# Patient Record
Sex: Male | Born: 1955 | Race: White | Hispanic: No | Marital: Married | Smoking: Never smoker
Health system: Southern US, Community
[De-identification: ages and names within clinical notes are randomized; demographics above are authoritative.]

## PROBLEM LIST (undated history)

## (undated) DIAGNOSIS — N189 Chronic kidney disease, unspecified: Secondary | ICD-10-CM

## (undated) DIAGNOSIS — J189 Pneumonia, unspecified organism: Secondary | ICD-10-CM

## (undated) DIAGNOSIS — N200 Calculus of kidney: Secondary | ICD-10-CM

## (undated) DIAGNOSIS — G4730 Sleep apnea, unspecified: Secondary | ICD-10-CM

## (undated) DIAGNOSIS — D126 Benign neoplasm of colon, unspecified: Secondary | ICD-10-CM

## (undated) HISTORY — DX: Calculus of kidney: N20.0

## (undated) HISTORY — DX: Chronic kidney disease, unspecified: N18.9

## (undated) HISTORY — DX: Sleep apnea, unspecified: G47.30

## (undated) HISTORY — DX: Pneumonia, unspecified organism: J18.9

## (undated) HISTORY — PX: POLYPECTOMY: SHX149

## (undated) HISTORY — PX: HAND SURGERY: SHX662

## (undated) HISTORY — DX: Benign neoplasm of colon, unspecified: D12.6

## (undated) HISTORY — PX: HERNIA REPAIR: SHX51

## (undated) HISTORY — PX: COLONOSCOPY: SHX174

---

## 2000-10-11 ENCOUNTER — Encounter: Payer: Self-pay | Admitting: Emergency Medicine

## 2000-10-11 ENCOUNTER — Emergency Department (HOSPITAL_COMMUNITY): Admission: EM | Admit: 2000-10-11 | Discharge: 2000-10-11 | Payer: Self-pay | Admitting: Emergency Medicine

## 2009-08-31 ENCOUNTER — Emergency Department (HOSPITAL_COMMUNITY): Admission: EM | Admit: 2009-08-31 | Discharge: 2009-08-31 | Payer: Self-pay | Admitting: Emergency Medicine

## 2010-06-07 ENCOUNTER — Encounter (INDEPENDENT_AMBULATORY_CARE_PROVIDER_SITE_OTHER): Payer: Self-pay | Admitting: *Deleted

## 2010-06-09 ENCOUNTER — Encounter (INDEPENDENT_AMBULATORY_CARE_PROVIDER_SITE_OTHER): Payer: Self-pay | Admitting: *Deleted

## 2010-06-13 ENCOUNTER — Encounter: Payer: Self-pay | Admitting: Gastroenterology

## 2010-06-15 NOTE — Letter (Signed)
Summary: Pre Visit Letter Revised  [facility]  [address] [place], [place] [address]   Phone: [phone]  Fax: [phone]        06/07/2010 MRN: [id number] Tom Richardson [address] [place], [place]  [address]             Procedure Date: 06/27/2010 @ 4:00   Direct colon-Dr. Russella Dar   Welcome to the Gastroenterology Division at [facility].    You are scheduled to see a nurse for your pre-procedure visit on 06/13/2010 at 4:30 on the 3rd floor at [facility], [address]  We ask that you try to arrive at our office 15 minutes prior to your appointment time to allow for check-in.  Please take a minute to review the attached form.  If you answer "Yes" to one or more of the questions on the first page, we ask that you call the person listed at your earliest opportunity.  If you answer "No" to all of the questions, please complete the rest of the form and bring it to your appointment.    Your nurse visit will consist of discussing your medical and surgical history, your immediate family medical history, and your medications.   If you are unable to list all of your medications on the form, please bring the medication bottles to your appointment and we will list them.  We will need to be aware of both prescribed and over the counter drugs.  We will need to know exact dosage information as well.    Please be prepared to read and sign documents such as consent forms, a financial agreement, and acknowledgement forms.  If necessary, and with your consent, a friend or relative is welcome to sit-in on the nurse visit with you.  Please bring your insurance card so that we may make a copy of it.  If your insurance requires a referral to see a specialist, please bring your referral form from your primary care physician.  No co-pay is required for this nurse visit.     If you cannot keep your appointment, please call [phone] to cancel or reschedule prior to your  appointment date.  This allows [place] the opportunity to schedule an appointment for another patient in need of care.    Thank you for choosing [facility] for your medical needs.  We appreciate the opportunity to care for you.  Please visit [place] at our website  to learn more about our practice.  Sincerely, The Gastroenterology Division

## 2010-06-23 NOTE — Letter (Signed)
Summary: Moviprep Instructions  [facility]  [address]   [place], [place] [address]   Phone: [phone]  Fax: [phone]       JGUADALUPE OPIELA    11/16/1955    MRN: [id number]        Procedure Day Dorna Bloom: Monday, 06-27-10     Arrival Time: 3:00 p.m.      Procedure Time: 4:00 p.m.     Location of Procedure:                    x  [facility] (4th Floor)                        PREPARATION FOR COLONOSCOPY WITH MOVIPREP   Starting 5 days prior to your procedure 06-22-10 do not eat nuts, seeds, popcorn, corn, beans, peas,  salads, or any raw vegetables.  Do not take any fiber supplements (e.g. Metamucil, Citrucel, and Benefiber).  THE DAY BEFORE YOUR PROCEDURE         DATE: 06-26-10  DAY: Sunday  1.  Drink clear liquids the entire day-NO SOLID FOOD  2.  Do not drink anything colored red or purple.  Avoid juices with pulp.  No orange juice.  3.  Drink at least 64 oz. (8 glasses) of fluid/clear liquids during the day to prevent dehydration and help the prep work efficiently.  CLEAR LIQUIDS INCLUDE: Water Jello Ice Popsicles Tea (sugar ok, no milk/cream) Powdered fruit flavored drinks Coffee (sugar ok, no milk/cream) Gatorade Juice: apple, white grape, white cranberry  Lemonade Clear bullion, consomm, broth Carbonated beverages (any kind) Strained chicken noodle soup Hard Candy                             4.  In the morning, mix first dose of MoviPrep solution:    Empty 1 Pouch A and 1 Pouch B into the disposable container    Add lukewarm drinking water to the top line of the container. Mix to dissolve    Refrigerate (mixed solution should be used within 24 hrs)  5.  Begin drinking the prep at 5:00 p.m. The MoviPrep container is divided by 4 marks.   Every 15 minutes drink the solution down to the next mark (approximately 8 oz) until the full liter is complete.   6.  Follow completed prep with 16 oz of clear liquid of your choice  (Nothing red or purple).  Continue to drink clear liquids until bedtime.  7.  Before going to bed, mix second dose of MoviPrep solution:    Empty 1 Pouch A and 1 Pouch B into the disposable container    Add lukewarm drinking water to the top line of the container. Mix to dissolve    Refrigerate  THE DAY OF YOUR PROCEDURE      DATE:  06-27-10 DAY: Monday  Beginning at 11:00  a.m. (5 hours before procedure):         1. Every 15 minutes, drink the solution down to the next mark (approx 8 oz) until the full liter is complete.  2. Follow completed prep with 16 oz. of clear liquid of your choice.    3. You may drink clear liquids until  2:00 p.m. (2 hours before procedure)   MEDICATION INSTRUCTIONS  Unless otherwise instructed, you should take regular prescription medications with a small sip of water   as early as possible  the morning of your procedure.           OTHER INSTRUCTIONS  You will need a responsible adult at least 53 years of age to accompany you and drive you home.   This person must remain in the waiting room during your procedure.  Wear loose fitting clothing that is easily removed.  Leave jewelry and other valuables at home.  However, you may wish to bring a book to read or  an iPod/MP3 player to listen to music as you wait for your procedure to start.  Remove all body piercing jewelry and leave at home.  Total time from sign-in until discharge is approximately 2-3 hours.  You should go home directly after your procedure and rest.  You can resume normal activities the  day after your procedure.  The day of your procedure you should not:   Drive   Make legal decisions   Operate machinery   Drink alcohol   Return to work  You will receive specific instructions about eating, activities and medications before you leave.    The above instructions have been reviewed and explained to me by   Ezra Sites RN  June 13, 2010 4:42 PM     I fully  understand and can verbalize these instructions _____________________________ Date _________

## 2010-06-23 NOTE — Miscellaneous (Signed)
Summary: [facility]  Clinical Lists Changes  Medications: Added new medication of MOVIPREP 100 GM  SOLR (PEG-KCL-NACL-NASULF-NA ASC-C) As per prep instructions. - Signed Rx of MOVIPREP 100 GM  SOLR (PEG-KCL-NACL-NASULF-NA ASC-C) As per prep instructions.;  #1 x 0;  Signed;  Entered by: Ezra Sites RN;  Authorized by: Meryl Dare MD [facility];  Method used: [facility]  [place] [address], [place], [place]  [address], Ph: [phone] or [phone], Fax: [phone] Allergies: Added new allergy or adverse reaction of TETRACYCLINE Observations: Added new observation of NKA: F (06/13/2010 16:19)    Prescriptions: MOVIPREP 100 GM  SOLR (PEG-KCL-NACL-NASULF-NA ASC-C) As per prep instructions.  #1 x 0   Entered by:   Ezra Sites RN   Authorized by:   Meryl Dare MD [facility]   Signed by:   Ezra Sites RN on 06/13/2010   Method used:   [facility]        [facility]  [place] [address] (retail)       [address]       [place], [place]  [address]       Ph: [phone] or [phone]       Fax: [phone]   RxID:   [id number]

## 2010-06-24 ENCOUNTER — Telehealth: Payer: Self-pay | Admitting: Gastroenterology

## 2010-06-27 ENCOUNTER — Other Ambulatory Visit: Payer: Self-pay | Admitting: Gastroenterology

## 2010-06-27 ENCOUNTER — Other Ambulatory Visit (AMBULATORY_SURGERY_CENTER): Payer: BC Managed Care – PPO | Admitting: Gastroenterology

## 2010-06-27 DIAGNOSIS — K5730 Diverticulosis of large intestine without perforation or abscess without bleeding: Secondary | ICD-10-CM

## 2010-06-27 DIAGNOSIS — K620 Anal polyp: Secondary | ICD-10-CM

## 2010-06-27 DIAGNOSIS — Z1211 Encounter for screening for malignant neoplasm of colon: Secondary | ICD-10-CM

## 2010-06-27 DIAGNOSIS — D126 Benign neoplasm of colon, unspecified: Secondary | ICD-10-CM

## 2010-06-27 DIAGNOSIS — K621 Rectal polyp: Secondary | ICD-10-CM

## 2010-06-27 HISTORY — DX: Benign neoplasm of colon, unspecified: D12.6

## 2010-06-29 NOTE — Progress Notes (Signed)
Summary: Triage  Phone Note Call from Patient Call back at Work Phone [phone]   Caller: Patient Call For: Dr. Russella Dar Reason for Call: Talk to Nurse Summary of Call: Has a upper respiratory injection and taking a Z-pac. Wants to know if it will affect his COL on Monday Initial call taken by: Karna Christmas,  June 24, 2010 9:34 AM  Follow-up for Phone Call        Spoke with pt.  He had a fever two days ago, but has not had one since.  He will finish his zpack on Saturday night.  Ok given to come in for procedure, but told to call if symptoms worsen or he develops a fever. Follow-up by: Karl Bales RN,  June 24, 2010 9:57 AM

## 2010-06-30 ENCOUNTER — Encounter: Payer: Self-pay | Admitting: Gastroenterology

## 2010-07-05 NOTE — Letter (Signed)
Summary: Patient Notice- Polyp Results  [facility]  [address] [place], [place] [address]   Phone: [phone]  Fax: [phone]        June 30, 2010 MRN: [id number]    Tom Richardson [address] [place], [place]  [address]    Dear [name],  I am pleased to inform you that the colon polyp(s) removed during your recent colonoscopy was (were) found to be benign (no cancer detected) upon pathologic examination.  I recommend you have a repeat colonoscopy examination in 5 years to look for recurrent polyps, as having colon polyps increases your risk for having recurrent polyps or even colon cancer in the future.  Should you develop new or worsening symptoms of abdominal pain, bowel habit changes or bleeding from the rectum or bowels, please schedule an evaluation with either your primary care physician or with me.  Continue treatment plan as outlined the day of your exam.  Please call us if you are having persistent problems or have questions about your condition that have not been fully answered at this time.  Sincerely,  Meryl Dare MD [facility]  This letter has been electronically signed by your physician.  Appended Document: Patient Notice- Polyp Results letter mailed

## 2010-07-05 NOTE — Procedures (Signed)
Summary: Colonoscopy  Patient: Tom Richardson Note: All result statuses are Final unless otherwise noted.  Tests: (1) Colonoscopy (COL)   COL Colonoscopy           DONE     [facility]     [address]     [place], [place]  [address]           COLONOSCOPY PROCEDURE REPORT           PATIENT:  Tom Richardson, Tom Richardson  MR#:  [id number]     BIRTHDATE:  05/12/1955, 54 yrs. old  GENDER:  male     ENDOSCOPIST:  Judie Petit T. Russella Dar, MD, [facility]     Referred by:  Prudy Feeler, PA     PROCEDURE DATE:  06/27/2010     PROCEDURE:  Colonoscopy with snare polypectomy     ASA CLASS:  Class I     INDICATIONS:  1) Routine Risk Screening     MEDICATIONS:   Fentanyl 100 mcg IV, Versed 10 mg IV     DESCRIPTION OF PROCEDURE:   After the risks benefits and     alternatives of the procedure were thoroughly explained, informed     consent was obtained.  Digital rectal exam was performed and     revealed no abnormalities.   The LB PCF-[id number] endoscope     was introduced through the anus and advanced to the cecum, which     was identified by both the appendix and ileocecal valve, without     limitations.  The quality of the prep was excellent, using     MoviPrep.  The instrument was then slowly withdrawn as the colon     was fully examined.     <<PROCEDUREIMAGES>>     FINDINGS:  Moderate diverticulosis was found in the sigmoid to     transverse colon. A sessile polyp was found in the rectum. It was     6 mm in size. Polyp was snared, then cauterized with monopolar     cautery. Retrieval was successful. Otherwise normal colonoscopy     without other polyps, masses, vascular ectasias, or inflammatory     changes. Retroflexed views in the rectum revealed no     abnormalities. The time to cecum =  3  minutes. The scope was then     withdrawn (time =  12  min) from the patient and the procedure     completed.           COMPLICATIONS:  None           ENDOSCOPIC IMPRESSION:     1) Moderate diverticulosis  in the sigmoid to transverse colon     2) 6 mm sessile polyp in the rectum           RECOMMENDATIONS:     1) Hold aspirin, aspirin products, and anti-inflamatory     medication for 2 weeks.     2) Await pathology results     3) High fiber diet with liberal fluid intake.     4) If the polyp is adenomatous (pre-cancerous), colonoscopy in 5     years. Otherwise follow colorectal cancer screening guidelines for     "routine risk" patients with colonoscopy in 10 years.           Venita Lick. Russella Dar, MD, [facility]           n.     eSIGNED:   Venita Lick. Migel Hannis at 06/27/2010 03:37 PM  [place], [place], [id number]  Note: An exclamation mark (!) indicates a result that was not dispersed into the flowsheet. Document Creation Date: 06/27/2010 3:38 PM _______________________________________________________________________  (1) Order result status: Final Collection or observation date-time: 06/27/2010 15:31 Requested date-time:  Receipt date-time:  Reported date-time:  Referring Physician:   Ordering Physician: Claudette Head [phone]) Specimen Source:  Source: Launa Grill Order Number: [id number] Lab site:   Appended Document: Colonoscopy     Procedures Next Due Date:    Colonoscopy: 07/2015

## 2010-07-26 LAB — POCT I-STAT, CHEM 8
BUN: 19 mg/dL (ref 6–23)
Calcium, Ion: 1.07 mmol/L — ABNORMAL LOW (ref 1.12–1.32)
Chloride: 108 mEq/L (ref 96–112)
Creatinine, Ser: 0.7 mg/dL (ref 0.4–1.5)
Glucose, Bld: 139 mg/dL — ABNORMAL HIGH (ref 70–99)
HCT: 42 % (ref 39.0–52.0)
Hemoglobin: 14.3 g/dL (ref 13.0–17.0)
Potassium: 4 mEq/L (ref 3.5–5.1)
Sodium: 140 mEq/L (ref 135–145)
TCO2: 21 mmol/L (ref 0–100)

## 2010-07-26 LAB — URINALYSIS, ROUTINE W REFLEX MICROSCOPIC
Bilirubin Urine: NEGATIVE
Glucose, UA: NEGATIVE mg/dL
Ketones, ur: 15 mg/dL — AB
Leukocytes, UA: NEGATIVE
Nitrite: NEGATIVE
Protein, ur: 30 mg/dL — AB
Specific Gravity, Urine: 1.023 (ref 1.005–1.030)
Urobilinogen, UA: 1 mg/dL (ref 0.0–1.0)
pH: 5.5 (ref 5.0–8.0)

## 2010-07-26 LAB — DIFFERENTIAL
Basophils Absolute: 0 10*3/uL (ref 0.0–0.1)
Basophils Relative: 1 % (ref 0–1)
Monocytes Absolute: 0.3 10*3/uL (ref 0.1–1.0)
Neutro Abs: 3.3 10*3/uL (ref 1.7–7.7)

## 2010-07-26 LAB — URINE MICROSCOPIC-ADD ON

## 2010-07-26 LAB — CBC
Hemoglobin: 14.6 g/dL (ref 13.0–17.0)
MCHC: 36 g/dL (ref 30.0–36.0)
RDW: 12.4 % (ref 11.5–15.5)

## 2010-07-26 LAB — URINE CULTURE
Colony Count: NO GROWTH
Culture: NO GROWTH

## 2010-09-23 NOTE — H&P (Signed)
Tom Richardson, Tom Richardson NO.:  [id number]   MEDICAL RECORD NO.:  [id number]          PATIENT TYPE:  INP   LOCATION:  3014                         FACILITY:  [facility]   PHYSICIAN:  Hewitt Shorts, M.D.DATE OF BIRTH:  06-03-1955   DATE OF ADMISSION:  06/17/2006  DATE OF DISCHARGE:                              HISTORY & PHYSICAL   HISTORY OF PRESENT ILLNESS:  The patient is a 55 year old right-handed  white male who was transferred from [facility] at the  request of Dr. Selinda Flavin with a diagnosis of a spinal headache  following a lumbar puncture 3 days ago.  This spinal headache has been  associated with disabling left parascapular pain and muscular spasm.   The patient's history began about 15 days ago.  Prior to that, he had  been in good health without any difficulties.   That day, he developed a left hemicranial headache associated with  numbness and tingling in the left side of his face involving the V2 and  V3 distributions.  His symptoms persisted, although after a couple of  days the headaches resolved.  However, the numbness and tingling  continued.  He noticed they would typically be worse when he was active  and seemed to lessen when he was sedentary.   Because of the persistent symptoms, he was concerned.  However, 4 days  after the headaches the numbness and tingling began.  He developed  epistaxis.  Because of this, he went to see Dr. Patrica Duel and  explained all the circumstances that had been going on over the  preceding 4 days.  Dr. Nobie Putnam was concerned and set him up for an MRI  and MRA that were done at [facility] the following day  and he was set up for an appointment with Dr. Benson Setting 6 days ago.  He  underwent neurologic workup with Dr. Benson Setting in [place].  The concern arose  about the possibility of multiple sclerosis and therefore the patient  underwent a lumbar puncture in Dr. Betsey Amen office 3 days ago.   The  patient explained that the spinal tap was difficult and required 6 or 7  sticks.  He was subsequently released to return home and went about his  more or less normal activities that day.  However, that evening he  developed acute and disabling pain and spasm around the left scapula.  Because this persisted, he went to the [facility].  He was given various medications and discharged to  home.  He returned to Dr. Benson Setting 2 days ago and because of the disabling  pain and spasm was transferred by ambulance to [facility] where he was admitted by the hospitalist service.  MRI of the  cervical and thoracic spine were obtained.  The MRI of the cervical  spine was interpreted as showing a left C5-6 cervical disk herniation.   Dr. Dimas Aguas, who saw the patient today, was concerned that he may well be  having a spinal headache of which much of the symptoms with a lot of  pain and spasm around the left scapula may be that the spinal headache  was associated with tension on the nerve root around the C5-6 disk  herniation, causing the radicular symptoms.   Dr. Dimas Aguas had suggested blood patch be done tomorrow.  However, the  patient's family requested transfer to our service and the patient was  accepted in transfer.   The patient explained that he has been having positional headaches that  began about 2 days ago, that are typically occipital.  They occur when  he sits up after a very brief period of time and are relieved by laying  down.  This is associated with some tightness in the neck, although he  does not describe specific neck pain.  He says that when he is having  bad pain and spasm around the left scapula, he will develop numbness to  the left upper extremity.   PAST MEDICAL HISTORY:  He denies any history of hypertension, myocardial  infarction, cancer, stroke, diabetes, peptic ulcer disease, or lung  disease.  No previous surgeries.   NO KNOWN ALLERGIES.  He takes no  medications on a regular basis.   FAMILY HISTORY:  His father and his brother are both patients of mine.  I have treated his father for a pituitary tumor, as well as done disk  surgery on him.  He has a history of hypercholesterolemia.  He is age  25.  His mother has a history of hypertension and is age 68.  I  performed an anterior cervical discectomy and fusion for his brother.   SOCIAL HISTORY:  The patient is married.  He works as a Chartered certified accountant.  He  quit smoking 9 years ago.  He drinks an occasional beer.   REVIEW OF SYSTEMS:  Notable for those described in his history of  present illness and past medical history, but he does not describe any  history of chest, pulmonary, abdominal, gastrointestinal, genitourinary  symptoms or difficulties.   PHYSICAL EXAMINATION:  GENERAL:  The patient is a well-developed, well-  nourished white male in no acute distress.  He is laying flat in the  bed.  VITALS:  Temperature is 98.4, pulse 67, blood pressure 119/73,  respiratory rate 20, oxygen saturation 96% on room air.  LUNGS:  Clear to auscultation.  He has symmetrical respiratory  excursion.  HEART:  Regular rate and rhythm.  S1 and S2.  There is no murmur.  ABDOMEN:  Soft, nondistended, bowel sounds present.  No masses are  palpable.  There is no tenderness to palpation in any quadrant.  MUSCULOSKELETAL:  Shows no discomfort in his shoulders on testing for  impingement.  There is no tenderness over the acromioclavicular joints  bilaterally.  NEUROLOGICAL:  Shows on mental status the patient is awake, alert, and  fully oriented.  Cranial nerves show decreased sensation at pinprick and  light touch in the V2 and V3 distributions on the left side, but they  are otherwise intact, including intact extraocular movements, intact  facial movements, hearing, palatal movement, shoulder shrug, and tongue is midline.  Motor examination shows 5/5 strength to the upper  and lower  extremities including deltoid, biceps, triceps, extrinsics, grip,  iliopsoas, dorsiflexors, longus and plantar flexors bilaterally.  Sensation is decreased to pinprick in the left upper extremity, but  intact otherwise to the right upper extremity, as well as to the lower  extremities bilaterally.  Reflexes are minimal to biceps, brachialis,  and triceps; 1 to 2 in the quadriceps,  minimally in the gastrocnemious,  they are symmetrical bilaterally.  Toes are downgoing  bilaterally.  Gait and stance are not tested due to his condition and wanted to keep  him flat in bed.   DIAGNOSTIC STUDIES:  I have reviewed his MRI scan of his cervical spine,  that was by CD-ROM disk, did see mild degenerative disk disease and mild  disk bulging.  I did not appreciate a significant disk protrusion at the  C5-6 level.  However, I plan to review the study with the radiologist  when available.   IMPRESSION:  1. Spinal headache, status post lumbar puncture 3 days ago evidenced      by positional headache over the past 2 days.  2. Left parascapular pain and spasm, the etiology of which is      uncertain.  It may be related to the spinal headache.  It may be      related to some cervical disk disease.  3. Left facial numbness and tingling, etiology of which is uncertain,      as well as some numbness in the left upper extremity.  Motor      function and reflex function are intact.   PLAN:  The patient will be admitted to the neurosurgical inpatient unit.  He will be continued on supportive IV fluids.  We have instructed him to  be on strict bedrest with his head flat and log rolling side to side  using a urinal and/or bed pan as needed.  He is to eat laying on his  side and not to sit up.  Will check a BMET in the morning and will  review his MRI and MRA of the brain, as well as MRI of cervical spine  with the radiologist.      Hewitt Shorts, M.D.  Electronically Signed      RWN/MEDQ  D:  06/17/2006  T:  06/18/2006  Job:  [id number]

## 2012-07-08 ENCOUNTER — Encounter: Payer: Self-pay | Admitting: Physician Assistant

## 2013-12-13 ENCOUNTER — Encounter (HOSPITAL_BASED_OUTPATIENT_CLINIC_OR_DEPARTMENT_OTHER): Payer: Self-pay | Admitting: Emergency Medicine

## 2013-12-13 ENCOUNTER — Emergency Department (HOSPITAL_BASED_OUTPATIENT_CLINIC_OR_DEPARTMENT_OTHER)
Admission: EM | Admit: 2013-12-13 | Discharge: 2013-12-14 | Disposition: A | Discharge status: Home / Self Care | Payer: No Typology Code available for payment source | Attending: Emergency Medicine | Admitting: Emergency Medicine

## 2013-12-13 DIAGNOSIS — Z79899 Other long term (current) drug therapy: Secondary | ICD-10-CM | POA: Insufficient documentation

## 2013-12-13 DIAGNOSIS — L02519 Cutaneous abscess of unspecified hand: Secondary | ICD-10-CM | POA: Diagnosis not present

## 2013-12-13 DIAGNOSIS — L03119 Cellulitis of unspecified part of limb: Secondary | ICD-10-CM | POA: Diagnosis not present

## 2013-12-13 DIAGNOSIS — Z87891 Personal history of nicotine dependence: Secondary | ICD-10-CM | POA: Diagnosis not present
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
## 2013-12-13 NOTE — ED Notes (Signed)
Room is set up with I&D tray and suture kit, etc. At bedside.

## 2013-12-13 NOTE — ED Notes (Signed)
Pt reports abscess to right hand, first noticed on Monday.

## 2013-12-13 NOTE — ED Notes (Signed)
Pt has swelling to left medial side of the hand with redness and pain.

## 2013-12-14 MED ORDER — SULFAMETHOXAZOLE-TRIMETHOPRIM 800-160 MG PO TABS: 2.0000 | ORAL_TABLET | Freq: Two times a day (BID) | ORAL | Status: DC

## 2013-12-14 MED ORDER — HYDROCODONE-ACETAMINOPHEN 5-325 MG PO TABS: 1.0000 | ORAL_TABLET | Freq: Four times a day (QID) | ORAL | Status: DC | PRN

## 2013-12-14 NOTE — ED Notes (Signed)
I completed wound care with heavy wrap of 4x4's over 2x2's then wrap of kerlix. I gave patient extra supplies to keep wound clean and dry.

## 2013-12-14 NOTE — Discharge Instructions (Signed)

## 2013-12-15 NOTE — ED Provider Notes (Addendum)
Medical screening examination/treatment/procedure(s) were conducted as a shared visit with non-physician practitioner(s) and myself.  I personally evaluated the patient during the encounter.  Tender, swollen area over the medial edge of the right hand consistent with abscess.    Wynetta Fines, MD 12/15/13 [phone]

## 2013-12-17 ENCOUNTER — Telehealth (HOSPITAL_BASED_OUTPATIENT_CLINIC_OR_DEPARTMENT_OTHER): Payer: Self-pay | Admitting: Emergency Medicine

## 2013-12-17 LAB — CULTURE, ROUTINE-ABSCESS
GRAM STAIN: NONE SEEN
SPECIAL REQUESTS: NORMAL

## 2013-12-18 ENCOUNTER — Telehealth (HOSPITAL_BASED_OUTPATIENT_CLINIC_OR_DEPARTMENT_OTHER): Payer: Self-pay | Admitting: Emergency Medicine

## 2013-12-18 NOTE — Telephone Encounter (Signed)
Post ED Visit - Positive Culture Follow-up   Positive Abscess culture Treated with Bactrim DS, organism sensitive to the same   Per Hazel Sams PA, call patient to ask about overall condition(fever,chills, etc.) and condition of infection. Remind pt to have a recheck of infection if not already done. If positive for fever or worse infection -return to ED right away.    Tom Richardson 12/18/2013, 4:17 PM

## 2013-12-19 ENCOUNTER — Telehealth (HOSPITAL_BASED_OUTPATIENT_CLINIC_OR_DEPARTMENT_OTHER): Payer: Self-pay | Admitting: Emergency Medicine

## 2013-12-19 NOTE — Telephone Encounter (Signed)
Post ED Visit - Positive Culture Follow-up  Culture report reviewed by antimicrobial stewardship pharmacist: []  Wes Ferguson, Pharm.D., BCPS []  Heide Guile, Pharm.D., BCPS []  Alycia Rossetti, Pharm.D., BCPS []  Clay, Pharm.D., BCPS, AAHIVP []  Legrand Como, Pharm.D., BCPS, AAHIVP []  Hassie Bruce, Pharm.D. [x]  Cassie Nicole Kindred, [place].D.  Positive abcess culture abundant MRSA  Treated with sufamethoxazole-trimethoprim 800-160mg  po tabs, take 2 tabs bid x 10 days, organism sensitive to the same and no further patient follow-up is required at this time.  Hazle Nordmann 12/19/2013, 10:50 AM

## 2014-08-27 ENCOUNTER — Emergency Department (HOSPITAL_BASED_OUTPATIENT_CLINIC_OR_DEPARTMENT_OTHER)
Admission: EM | Admit: 2014-08-27 | Discharge: 2014-08-27 | Disposition: A | Discharge status: Home / Self Care | Payer: No Typology Code available for payment source | Attending: Emergency Medicine | Admitting: Emergency Medicine

## 2014-08-27 ENCOUNTER — Emergency Department (HOSPITAL_BASED_OUTPATIENT_CLINIC_OR_DEPARTMENT_OTHER): Payer: No Typology Code available for payment source

## 2014-08-27 ENCOUNTER — Encounter (HOSPITAL_BASED_OUTPATIENT_CLINIC_OR_DEPARTMENT_OTHER): Payer: Self-pay | Admitting: *Deleted

## 2014-08-27 DIAGNOSIS — J181 Lobar pneumonia, unspecified organism: Secondary | ICD-10-CM | POA: Diagnosis not present

## 2014-08-27 DIAGNOSIS — Z791 Long term (current) use of non-steroidal anti-inflammatories (NSAID): Secondary | ICD-10-CM | POA: Insufficient documentation

## 2014-08-27 DIAGNOSIS — Z87891 Personal history of nicotine dependence: Secondary | ICD-10-CM | POA: Insufficient documentation

## 2014-08-27 DIAGNOSIS — Z792 Long term (current) use of antibiotics: Secondary | ICD-10-CM | POA: Diagnosis not present

## 2014-08-27 DIAGNOSIS — J189 Pneumonia, unspecified organism: Secondary | ICD-10-CM

## 2014-08-27 DIAGNOSIS — R509 Fever, unspecified: Secondary | ICD-10-CM | POA: Diagnosis present

## 2014-08-27 LAB — BASIC METABOLIC PANEL
Anion gap: 8 (ref 5–15)
BUN: 12 mg/dL (ref 6–23)
CHLORIDE: 106 mmol/L (ref 96–112)
CO2: 23 mmol/L (ref 19–32)
Calcium: 8.5 mg/dL (ref 8.4–10.5)
Creatinine, Ser: 0.89 mg/dL (ref 0.50–1.35)
GFR calc non Af Amer: >90 mL/min (ref >90)
Glucose, Bld: 143 mg/dL — ABNORMAL HIGH (ref 70–99)
POTASSIUM: 4 mmol/L (ref 3.5–5.1)
Sodium: 137 mmol/L (ref 135–145)

## 2014-08-27 LAB — CBC WITH DIFFERENTIAL/PLATELET
Basophils Absolute: 0 K/uL (ref 0.0–0.1)
Basophils Relative: 0 % (ref 0–1)
Eosinophils Absolute: 0.1 K/uL (ref 0.0–0.7)
Eosinophils Relative: 1 % (ref 0–5)
HCT: 39.3 % (ref 39.0–52.0)
Hemoglobin: 13.5 g/dL (ref 13.0–17.0)
Lymphocytes Relative: 13 % (ref 12–46)
Lymphs Abs: 1.2 K/uL (ref 0.7–4.0)
MCH: 30.7 pg (ref 26.0–34.0)
MCHC: 34.4 g/dL (ref 30.0–36.0)
MCV: 89.3 fL (ref 78.0–100.0)
Monocytes Absolute: 1 K/uL (ref 0.1–1.0)
Monocytes Relative: 11 % (ref 3–12)
Neutro Abs: 6.9 K/uL (ref 1.7–7.7)
Neutrophils Relative %: 75 % (ref 43–77)
Platelets: 254 K/uL (ref 150–400)
RBC: 4.4 MIL/uL (ref 4.22–5.81)
RDW: 12.9 % (ref 11.5–15.5)
WBC: 9.2 K/uL (ref 4.0–10.5)

## 2014-08-27 MED ORDER — AZITHROMYCIN 250 MG PO TABS: 250.0000 mg | ORAL_TABLET | Freq: Every day | ORAL | Status: DC

## 2014-08-27 MED ORDER — ACETAMINOPHEN 325 MG PO TABS
650.0000 mg | ORAL_TABLET | Freq: Once | ORAL | Status: AC
  Administered 2014-08-27: 650 mg via ORAL
  Filled 2014-08-27: qty 2

## 2014-08-27 MED ORDER — SODIUM CHLORIDE 0.9 % IV SOLN
Freq: Once | INTRAVENOUS | Status: AC
  Administered 2014-08-27: 05:00:00 via INTRAVENOUS

## 2014-08-27 MED ORDER — AZITHROMYCIN 250 MG PO TABS
500.0000 mg | ORAL_TABLET | Freq: Once | ORAL | Status: AC
  Administered 2014-08-27: 500 mg via ORAL
  Filled 2014-08-27: qty 2

## 2014-08-27 NOTE — ED Notes (Signed)
C/o fever, productive cough, sob, weak, chills, and mid upper back pain, (denies: dizziness, nvd), took mucinex at 1800, rates pain 6/10, no antipyretics taken, pt of Dr. Ronnald Ramp in [place]. Mentions exposed to tick 2 weeks ago.

## 2014-08-27 NOTE — ED Notes (Signed)
Dr. Delo at [facility].  

## 2014-08-27 NOTE — Discharge Instructions (Signed)
Zithromax as prescribed.  Ibuprofen 600 mg rotated with Tylenol 1000 mg every 4 hours as needed for pain or fever.  Return to the emergency department for worsening chest pain, difficulty breathing, or other new and concerning symptoms.   Pneumonia Pneumonia is an infection of the lungs.  CAUSES Pneumonia may be caused by bacteria or a virus. Usually, these infections are caused by breathing infectious particles into the lungs (respiratory tract). SIGNS AND SYMPTOMS   Cough.  Fever.  Chest pain.  Increased rate of breathing.  Wheezing.  Mucus production. DIAGNOSIS  If you have the common symptoms of pneumonia, your health care provider will typically confirm the diagnosis with a chest X-ray. The X-ray will show an abnormality in the lung (pulmonary infiltrate) if you have pneumonia. Other tests of your blood, urine, or sputum may be done to find the specific cause of your pneumonia. Your health care provider may also do tests (blood gases or pulse oximetry) to see how well your lungs are working. TREATMENT  Some forms of pneumonia may be spread to other people when you cough or sneeze. You may be asked to wear a mask before and during your exam. Pneumonia that is caused by bacteria is treated with antibiotic medicine. Pneumonia that is caused by the influenza virus may be treated with an antiviral medicine. Most other viral infections must run their course. These infections will not respond to antibiotics.  HOME CARE INSTRUCTIONS   Cough suppressants may be used if you are losing too much rest. However, coughing protects you by clearing your lungs. You should avoid using cough suppressants if you can.  Your health care provider may have prescribed medicine if he or she thinks your pneumonia is caused by bacteria or influenza. Finish your medicine even if you start to feel better.  Your health care provider may also prescribe an expectorant. This loosens the mucus to be coughed  up.  Take medicines only as directed by your health care provider.  Do not smoke. Smoking is a common cause of bronchitis and can contribute to pneumonia. If you are a smoker and continue to smoke, your cough may last several weeks after your pneumonia has cleared.  A cold steam vaporizer or humidifier in your room or home may help loosen mucus.  Coughing is often worse at night. Sleeping in a semi-upright position in a recliner or using a couple pillows under your head will help with this.  Get rest as you feel it is needed. Your body will usually let you know when you need to rest. PREVENTION A pneumococcal shot (vaccine) is available to prevent a common bacterial cause of pneumonia. This is usually suggested for:  People over 69 years old.  Patients on chemotherapy.  People with chronic lung problems, such as bronchitis or emphysema.  People with immune system problems. If you are over 65 or have a high risk condition, you may receive the pneumococcal vaccine if you have not received it before. In some countries, a routine influenza vaccine is also recommended. This vaccine can help prevent some cases of pneumonia.You may be offered the influenza vaccine as part of your care. If you smoke, it is time to quit. You may receive instructions on how to stop smoking. Your health care provider can provide medicines and counseling to help you quit. SEEK MEDICAL CARE IF: You have a fever. SEEK IMMEDIATE MEDICAL CARE IF:   Your illness becomes worse. This is especially true if you are elderly or  weakened from any other disease.  You cannot control your cough with suppressants and are losing sleep.  You begin coughing up blood.  You develop pain which is getting worse or is uncontrolled with medicines.  Any of the symptoms which initially brought you in for treatment are getting worse rather than better.  You develop shortness of breath or chest pain. MAKE SURE YOU:   Understand  these instructions.  Will watch your condition.  Will get help right away if you are not doing well or get worse. Document Released: 04/24/2005 Document Revised: 09/08/2013 Document Reviewed: 07/14/2010 [facility] Patient Information 2015 [place], [place]. This information is not intended to replace advice given to you by your health care provider. Make sure you discuss any questions you have with your health care provider.

## 2014-08-27 NOTE — ED Notes (Signed)
Dr. Stark Jock at [facility], wife at [facility], pt alert, NAD, calm, interactive, VSS.

## 2014-08-27 NOTE — ED Provider Notes (Signed)
CSN: [id number]     Arrival date & time 08/27/14  0423 History   First MD Initiated Contact with Patient 08/27/14 [phone]     Chief Complaint  Patient presents with  . Fever     (Consider location/radiation/quality/duration/timing/severity/associated sxs/prior Treatment) HPI Comments: Patient is a 59 year old male with no significant past medical history. He presents for evaluation of chest congestion, fever, body aches, chills that have worsened over the past 3 days. He reports productive cough of yellow sputum. He denies any chest pain.  Patient is a 59 y.o. male presenting with fever. The history is provided by the patient.  Fever Temp source:  Subjective Severity:  Moderate Onset quality:  Sudden Duration:  3 days Timing:  Intermittent Progression:  Worsening Chronicity:  New Relieved by:  Nothing Worsened by:  Nothing tried Ineffective treatments:  None tried Associated symptoms: chills, congestion and cough   Associated symptoms: no chest pain     History reviewed. No pertinent past medical history. Past Surgical History  Procedure Laterality Date  . Hernia repair    . Hand surgery     No family history on file. History  Substance Use Topics  . Smoking status: Former Research scientist (life sciences)  . Smokeless tobacco: Not on file  . Alcohol Use: No     Comment: former, "recovering alcoholic"    Review of Systems  Constitutional: Positive for fever and chills.  HENT: Positive for congestion.   Respiratory: Positive for cough.   Cardiovascular: Negative for chest pain.  All other systems reviewed and are negative.     Allergies  Tetracycline  Home Medications   Prior to Admission medications   Medication Sig Start Date End Date Taking? Authorizing Provider  HYDROcodone-acetaminophen (NORCO/VICODIN) 5-325 MG per tablet Take 1-2 tablets by mouth every 6 (six) hours as needed. 12/14/13   Heather Laisure, PA-C  meloxicam (MOBIC) 15 MG tablet Take 15 mg by mouth daily.    Historical  Provider, MD  sulfamethoxazole-trimethoprim (SEPTRA DS) 800-160 MG per tablet Take 2 tablets by mouth 2 (two) times daily. 12/14/13   Heather Laisure, PA-C   BP 144/73 mmHg  Pulse 89  Temp(Src) 99.5 F (37.5 C) (Oral)  Resp 22  Ht 5\' 9"  (1.753 m)  Wt 190 lb (86.183 kg)  BMI 28.05 kg/m2  SpO2 96% Physical Exam  Constitutional: He is oriented to person, place, and time. He appears well-developed and well-nourished. No distress.  HENT:  Head: Normocephalic and atraumatic.  Mouth/Throat: Oropharynx is clear and moist.  Neck: Normal range of motion. Neck supple.  Cardiovascular: Normal rate, regular rhythm and normal heart sounds.   No murmur heard. Pulmonary/Chest: Effort normal. No respiratory distress. He has no wheezes. He has rales.  There are rales in the left base.  Abdominal: Soft. Bowel sounds are normal. He exhibits no distension. There is no tenderness.  Musculoskeletal: Normal range of motion. He exhibits no edema.  Lymphadenopathy:    He has no cervical adenopathy.  Neurological: He is alert and oriented to person, place, and time.  Skin: Skin is warm and dry. He is not diaphoretic.  Nursing note and vitals reviewed.   ED Course  Procedures (including critical care time) Labs Review Labs Reviewed  BASIC METABOLIC PANEL  CBC WITH DIFFERENTIAL/PLATELET  TROPONIN I    Imaging Review No results found.   EKG Interpretation None      MDM   Final diagnoses:  None    Patient is a 59 year old male who presents with chest congestion  and cough for the past several days. He reports fever at home and is borderline febrile here in the ER. His exam reveals crackles in the left base. Chest x-ray confirms a pneumonia in the left lower lung. His oxygen saturations are adequate and vitals are stable. He has no elevation of white count, and does not appear septic or toxic. He will be treated with Zithromax and when necessary return.    Veryl Speak, MD 08/27/14 [phone]

## 2015-06-09 ENCOUNTER — Encounter: Payer: Self-pay | Admitting: Gastroenterology

## 2015-10-27 ENCOUNTER — Other Ambulatory Visit: Payer: Self-pay

## 2015-10-27 ENCOUNTER — Emergency Department (HOSPITAL_COMMUNITY): Payer: BLUE CROSS/BLUE SHIELD

## 2015-10-27 ENCOUNTER — Emergency Department (HOSPITAL_COMMUNITY)
Admission: EM | Admit: 2015-10-27 | Discharge: 2015-10-27 | Disposition: A | Discharge status: Home / Self Care | Payer: BLUE CROSS/BLUE SHIELD | Attending: Emergency Medicine | Admitting: Emergency Medicine

## 2015-10-27 ENCOUNTER — Encounter (HOSPITAL_COMMUNITY): Payer: Self-pay | Admitting: *Deleted

## 2015-10-27 DIAGNOSIS — J189 Pneumonia, unspecified organism: Secondary | ICD-10-CM | POA: Diagnosis not present

## 2015-10-27 DIAGNOSIS — R05 Cough: Secondary | ICD-10-CM | POA: Diagnosis present

## 2015-10-27 DIAGNOSIS — M549 Dorsalgia, unspecified: Secondary | ICD-10-CM | POA: Diagnosis not present

## 2015-10-27 DIAGNOSIS — Z87891 Personal history of nicotine dependence: Secondary | ICD-10-CM | POA: Diagnosis not present

## 2015-10-27 LAB — CBC WITH DIFFERENTIAL/PLATELET
BASOS ABS: 0 10*3/uL (ref 0.0–0.1)
Basophils Relative: 0 %
EOS ABS: 0.1 10*3/uL (ref 0.0–0.7)
EOS PCT: 1 %
HCT: 38.9 % — ABNORMAL LOW (ref 39.0–52.0)
Hemoglobin: 13.5 g/dL (ref 13.0–17.0)
LYMPHS ABS: 1.4 10*3/uL (ref 0.7–4.0)
Lymphocytes Relative: 12 %
MCH: 31 pg (ref 26.0–34.0)
MCHC: 34.7 g/dL (ref 30.0–36.0)
MCV: 89.4 fL (ref 78.0–100.0)
MONO ABS: 1.2 10*3/uL — AB (ref 0.1–1.0)
Monocytes Relative: 10 %
Neutro Abs: 9.4 10*3/uL — ABNORMAL HIGH (ref 1.7–7.7)
Neutrophils Relative %: 77 %
PLATELETS: 258 10*3/uL (ref 150–400)
RBC: 4.35 MIL/uL (ref 4.22–5.81)
RDW: 12.7 % (ref 11.5–15.5)
WBC: 12.1 10*3/uL — AB (ref 4.0–10.5)

## 2015-10-27 LAB — COMPREHENSIVE METABOLIC PANEL
ALT: 27 U/L (ref 17–63)
ANION GAP: 7 (ref 5–15)
AST: 17 U/L (ref 15–41)
Albumin: 4 g/dL (ref 3.5–5.0)
Alkaline Phosphatase: 59 U/L (ref 38–126)
BUN: 12 mg/dL (ref 6–20)
CHLORIDE: 104 mmol/L (ref 101–111)
CO2: 25 mmol/L (ref 22–32)
CREATININE: 0.8 mg/dL (ref 0.61–1.24)
Calcium: 8.6 mg/dL — ABNORMAL LOW (ref 8.9–10.3)
Glucose, Bld: 119 mg/dL — ABNORMAL HIGH (ref 65–99)
POTASSIUM: 3.9 mmol/L (ref 3.5–5.1)
SODIUM: 136 mmol/L (ref 135–145)
Total Bilirubin: 1 mg/dL (ref 0.3–1.2)
Total Protein: 7 g/dL (ref 6.5–8.1)

## 2015-10-27 MED ORDER — IOPAMIDOL (ISOVUE-300) INJECTION 61%
75.0000 mL | Freq: Once | INTRAVENOUS | Status: AC | PRN
  Administered 2015-10-27: 75 mL via INTRAVENOUS

## 2015-10-27 MED ORDER — IBUPROFEN 600 MG PO TABS: 600.0000 mg | ORAL_TABLET | Freq: Four times a day (QID) | ORAL | Status: DC | PRN

## 2015-10-27 MED ORDER — SODIUM CHLORIDE 0.9 % IV BOLUS (SEPSIS)
1000.0000 mL | Freq: Once | INTRAVENOUS | Status: AC
  Administered 2015-10-27: 1000 mL via INTRAVENOUS
  Filled 2015-10-27: qty 1000

## 2015-10-27 MED ORDER — KETOROLAC TROMETHAMINE 30 MG/ML IJ SOLN
30.0000 mg | Freq: Once | INTRAMUSCULAR | Status: AC
  Administered 2015-10-27: 30 mg via INTRAVENOUS
  Filled 2015-10-27: qty 1

## 2015-10-27 MED ORDER — LEVOFLOXACIN IN D5W 750 MG/150ML IV SOLN
750.0000 mg | Freq: Once | INTRAVENOUS | Status: AC
Start: 2015-10-27 — End: 2015-10-27
  Administered 2015-10-27: 750 mg via INTRAVENOUS
  Filled 2015-10-27: qty 150

## 2015-10-27 MED ORDER — LEVOFLOXACIN 750 MG PO TABS: 750.0000 mg | ORAL_TABLET | Freq: Every day | ORAL | Status: DC

## 2015-10-27 NOTE — ED Provider Notes (Signed)
CSN: [id number]     Arrival date & time 10/27/15  [id number] History   First MD Initiated Contact with Patient 10/27/15 [phone]     Chief Complaint  Patient presents with  . Cough     (Consider location/radiation/quality/duration/timing/severity/associated sxs/prior Treatment) HPI Patient presents with left sided thoracic back pain worse with coughing and deep breathing, cough productive of green sputum, subjective fevers and chills, diaphoresis, fatigue and shortness of breath starting late Saturday evening and into Sunday. Patient states he's had multiple episodes of pneumonia in the past. Most recently was last year. Unresponsive to first 2 rounds of antibiotics. Took 20 days of antibiotics before resolution of symptoms. Patient did receive the pneumonia vaccine. He denies history of smoking. No new lower extremity swelling or pain. History reviewed. No pertinent past medical history. Past Surgical History  Procedure Laterality Date  . Hernia repair    . Hand surgery     No family history on file. Social History  Substance Use Topics  . Smoking status: Former Research scientist (life sciences)  . Smokeless tobacco: None  . Alcohol Use: No     Comment: former, "recovering alcoholic"    Review of Systems  Constitutional: Positive for fever, chills and fatigue.  HENT: Positive for sore throat. Negative for congestion.   Respiratory: Positive for cough, chest tightness, shortness of breath and wheezing.   Cardiovascular: Negative for chest pain.  Gastrointestinal: Negative for nausea, vomiting, abdominal pain and diarrhea.  Musculoskeletal: Positive for myalgias and back pain. Negative for neck pain and neck stiffness.  Skin: Negative for rash and wound.  Neurological: Negative for dizziness, light-headedness, numbness and headaches. Weakness: generalized.  All other systems reviewed and are negative.     Allergies  Tetracycline  Home Medications   Prior to Admission medications   Medication Sig Start Date  End Date Taking? Authorizing Provider  azithromycin (ZITHROMAX) 250 MG tablet Take 1 tablet (250 mg total) by mouth daily. 08/27/14   Veryl Speak, MD  HYDROcodone-acetaminophen (NORCO/VICODIN) 5-325 MG per tablet Take 1-2 tablets by mouth every 6 (six) hours as needed. 12/14/13   Heather Laisure, PA-C  ibuprofen (ADVIL,MOTRIN) 600 MG tablet Take 1 tablet (600 mg total) by mouth every 6 (six) hours as needed for moderate pain. 10/27/15   Julianne Rice, MD  levofloxacin (LEVAQUIN) 750 MG tablet Take 1 tablet (750 mg total) by mouth daily. X 14 days 10/27/15   Julianne Rice, MD  meloxicam (MOBIC) 15 MG tablet Take 15 mg by mouth daily.    Historical Provider, MD  sulfamethoxazole-trimethoprim (SEPTRA DS) 800-160 MG per tablet Take 2 tablets by mouth 2 (two) times daily. 12/14/13   Heather Laisure, PA-C   BP 132/86 mmHg  Pulse 75  Temp(Src) 98.1 F (36.7 C) (Oral)  Resp 13  Ht 5\' 9"  (1.753 m)  Wt 190 lb (86.183 kg)  BMI 28.05 kg/m2  SpO2 97% Physical Exam  Constitutional: He is oriented to person, place, and time. He appears well-developed and well-nourished. No distress.  HENT:  Head: Normocephalic and atraumatic.  Mouth/Throat: Oropharynx is clear and moist. No oropharyngeal exudate.  Eyes: EOM are normal. Pupils are equal, round, and reactive to light.  Neck: Normal range of motion. Neck supple.  Cardiovascular: Normal rate and regular rhythm.  Exam reveals no gallop and no friction rub.   No murmur heard. Pulmonary/Chest: Effort normal. No respiratory distress. He has no wheezes. He has rales. He exhibits no tenderness.  Rales in the left lung field  Abdominal: Soft. Bowel sounds  are normal. He exhibits no distension and no mass. There is no tenderness. There is no rebound and no guarding.  Musculoskeletal: Normal range of motion. He exhibits tenderness. He exhibits no edema.  Tenderness to palpation over the medial border of the left scapula. No midline tenderness. No lower extremity  swelling or tenderness. Distal pulses are equal and intact.  Lymphadenopathy:    He has no cervical adenopathy.  Neurological: He is alert and oriented to person, place, and time.  5/5 motor in all extremity. Sensation is fully intact.  Skin: Skin is warm and dry. No rash noted. No erythema.  Psychiatric: He has a normal mood and affect. His behavior is normal.  Nursing note and vitals reviewed.   ED Course  Procedures (including critical care time) Labs Review Labs Reviewed  CBC WITH DIFFERENTIAL/PLATELET - Abnormal; Notable for the following:    WBC 12.1 (*)    HCT 38.9 (*)    Neutro Abs 9.4 (*)    Monocytes Absolute 1.2 (*)    All other components within normal limits  COMPREHENSIVE METABOLIC PANEL - Abnormal; Notable for the following:    Glucose, Bld 119 (*)    Calcium 8.6 (*)    All other components within normal limits    Imaging Review Dg Chest 2 View  10/27/2015  CLINICAL DATA:  Productive cough. Shortness of breath. Intermittent fever. Left upper back pain for 4 days. EXAM: CHEST  2 VIEW COMPARISON:  08/27/2014 FINDINGS: Abnormal streaky retrocardiac airspace opacity on the left. This is in a similar location to the exam from 14 months ago. Mild enlargement of the cardiopericardial silhouette. Some of this may be due to low lung volumes. Slight nodularity at the left lung apex appears stable from last year. IMPRESSION: 1. Retrocardiac linear airspace opacities not dissimilar to the prior exam from 08/27/2014, concerning for recurrent pneumonia in this clinical circumstance. The possibility of a chronic airspace opacity is raised, and I would recommend followup chest radiography in 4 weeks time to ensure clearance ; if this does not clear, then chest CT (with contrast if feasible) would be recommended in order to exclude the possibility of a central obstructing bronchial mass. Alternatively, chest CT could be obtained now. Electronically Signed   By: Van Clines M.D.   On:  10/27/2015 07:08   Ct Chest W Contrast  10/27/2015  CLINICAL DATA:  Productive cough, fever starting Sunday, green sputum, left back pain EXAM: CT CHEST WITH CONTRAST TECHNIQUE: Multidetector CT imaging of the chest was performed during intravenous contrast administration. CONTRAST:  56mL ISOVUE-300 IOPAMIDOL (ISOVUE-300) INJECTION 61% COMPARISON:  Chest x-ray 10/27/2015 FINDINGS: Mediastinum/Lymph Nodes: Central airways are patent. Images of the thoracic inlet are unremarkable. Precarinal lymph node Measures 9 mm short-axis not pathologic by size criteria. Mild atherosclerotic calcifications of coronary arteries. Mild atherosclerotic calcifications of aortic knob. Central pulmonary artery is unremarkable. No aortic aneurysm. Small hiatal hernia is noted. Lungs/Pleura: Images of the lung parenchyma shows no pulmonary edema. As noted on chest x-ray there is infiltrate/consolidation with some air bronchogram in left lower lobe posterior medially best seen in axial image 112 series 3. This is highly suspicious for pneumonia. The right lung is clear. No pulmonary nodules are noted. There is accessory azygos fissure/ lobe noted. No definite pulmonary mass. Upper abdomen: The visualized upper abdomen shows elevation of the right hemidiaphragm. There is significant fatty infiltration of the liver. The visualized pancreas and spleen is unremarkable. No calcified gallstones are noted within gallbladder. No adrenal  gland mass is noted. Visualized upper kidneys are unremarkable. Musculoskeletal: No destructive bony lesions are noted. Sagittal images of the spine shows mild degenerative changes mid and lower thoracic spine. Sagittal view of the sternum is unremarkable. No destructive rib lesions are noted. IMPRESSION: 1. There is no mediastinal hematoma or adenopathy. 2. As noted on chest x-ray there is infiltrate/consolidation with air bronchogram in left lower lobe posterior medially highly suspicious for pneumonia.  Followup CT scan is recommended in 3-4 weeks following trial of antibiotic therapy to ensure resolution and exclude underlying malignancy. 3. No pulmonary edema. 4. Significant fatty infiltration of the liver. 5. Small hiatal hernia. 6. Mild degenerative changes thoracic spine. Electronically Signed   By: Lahoma Crocker M.D.   On: 10/27/2015 09:33   I have personally reviewed and evaluated these images and lab results as part of my medical decision-making.   EKG Interpretation   Date/Time:  Wednesday October 27 2015 08:18:23 EDT Ventricular Rate:  82 PR Interval:    QRS Duration: 84 QT Interval:  377 QTC Calculation: 441 R Axis:   132 Text Interpretation:  Sinus rhythm Left posterior fascicular block Low  voltage with right axis deviation Baseline wander in lead(s) V1 Confirmed  by Lita Mains  MD, Jecenia Leamer ([id number]) on 10/27/2015 11:19:06 AM      MDM   Final diagnoses:  CAP (community acquired pneumonia)   Given first dose of antibiotics in the emergency department. Patient is in no respiratory distress. Vital signs are stable. Advised to follow-up with the patient's primary physician in one week to assure improvement of symptoms. Patient has been given return precautions as voice understanding. He relayed will need repeat chest x-ray in 4 weeks to ensure clearance of infiltrate.     Julianne Rice, MD 10/27/15 1119

## 2015-10-27 NOTE — ED Notes (Addendum)
Pt c/o cough that is productive with green sputum, left side upper back pain, fever that started Sunday, has been taking muccinex with little improvement, states that he has had pneumonia before and he feels the same,

## 2015-10-27 NOTE — Discharge Instructions (Signed)
Make an appointment to follow-up with your primary care physician on Monday or Tuesday. Take antibiotics as prescribed. Return immediately for any worsening shortness of breath, pain or for any concerns.   Community-Acquired Pneumonia, Adult Pneumonia is an infection of the lungs. There are different types of pneumonia. One type can develop while a person is in a hospital. A different type, called community-acquired pneumonia, develops in people who are not, or have not recently been, in the hospital or other health care facility.  CAUSES Pneumonia may be caused by bacteria, viruses, or funguses. Community-acquired pneumonia is often caused by Streptococcus pneumonia bacteria. These bacteria are often passed from one person to another by breathing in droplets from the cough or sneeze of an infected person. RISK FACTORS The condition is more likely to develop in:  People who havechronic diseases, such as chronic obstructive pulmonary disease (COPD), asthma, congestive heart failure, cystic fibrosis, diabetes, or kidney disease.  People who haveearly-stage or late-stage HIV.  People who havesickle cell disease.  People who havehad their spleen removed (splenectomy).  People who havepoor Human resources officer.  People who havemedical conditions that increase the risk of breathing in (aspirating) secretions their own mouth and nose.   People who havea weakened immune system (immunocompromised).  People who smoke.  People whotravel to areas where pneumonia-causing germs commonly exist.  People whoare around animal habitats or animals that have pneumonia-causing germs, including birds, bats, rabbits, cats, and farm animals. SYMPTOMS Symptoms of this condition include:  Adry cough.  A wet (productive) cough.  Fever.  Sweating.  Chest pain, especially when breathing deeply or coughing.  Rapid breathing or difficulty breathing.  Shortness of breath.  Shaking  chills.  Fatigue.  Muscle aches. DIAGNOSIS Your health care provider will take a medical history and perform a physical exam. You may also have other tests, including:  Imaging studies of your chest, including X-rays.  Tests to check your blood oxygen level and other blood gases.  Other tests on blood, mucus (sputum), fluid around your lungs (pleural fluid), and urine. If your pneumonia is severe, other tests may be done to identify the specific cause of your illness. TREATMENT The type of treatment that you receive depends on many factors, such as the cause of your pneumonia, the medicines you take, and other medical conditions that you have. For most adults, treatment and recovery from pneumonia may occur at home. In some cases, treatment must happen in a hospital. Treatment may include:  Antibiotic medicines, if the pneumonia was caused by bacteria.  Antiviral medicines, if the pneumonia was caused by a virus.  Medicines that are given by mouth or through an IV tube.  Oxygen.  Respiratory therapy. Although rare, treating severe pneumonia may include:  Mechanical ventilation. This is done if you are not breathing well on your own and you cannot maintain a safe blood oxygen level.  Thoracentesis. This procedureremoves fluid around one lung or both lungs to help you breathe better. HOME CARE INSTRUCTIONS  Take over-the-counter and prescription medicines only as told by your health care provider.  Only takecough medicine if you are losing sleep. Understand that cough medicine can prevent your body's natural ability to remove mucus from your lungs.  If you were prescribed an antibiotic medicine, take it as told by your health care provider. Do not stop taking the antibiotic even if you start to feel better.  Sleep in a semi-upright position at night. Try sleeping in a reclining chair, or place a few pillows  under your head.  Do not use tobacco products, including cigarettes,  chewing tobacco, and e-cigarettes. If you need help quitting, ask your health care provider.  Drink enough water to keep your urine clear or pale yellow. This will help to thin out mucus secretions in your lungs. PREVENTION There are ways that you can decrease your risk of developing community-acquired pneumonia. Consider getting a pneumococcal vaccine if:  You are older than 58 years of age.  You are older than 58 years of age and are undergoing cancer treatment, have chronic lung disease, or have other medical conditions that affect your immune system. Ask your health care provider if this applies to you. There are different types and schedules of pneumococcal vaccines. Ask your health care provider which vaccination option is best for you. You may also prevent community-acquired pneumonia if you take these actions:  Get an influenza vaccine every year. Ask your health care provider which type of influenza vaccine is best for you.  Go to the dentist on a regular basis.  Wash your hands often. Use hand sanitizer if soap and water are not available. SEEK MEDICAL CARE IF:  You have a fever.  You are losing sleep because you cannot control your cough with cough medicine. SEEK IMMEDIATE MEDICAL CARE IF:  You have worsening shortness of breath.  You have increased chest pain.  Your sickness becomes worse, especially if you are an older adult or have a weakened immune system.  You cough up blood.   This information is not intended to replace advice given to you by your health care provider. Make sure you discuss any questions you have with your health care provider.   Document Released: 04/24/2005 Document Revised: 01/13/2015 Document Reviewed: 08/19/2014 Elsevier Interactive Patient Education [employer].

## 2015-10-27 NOTE — ED Notes (Signed)
Pt family educated about droplet precautions.

## 2015-11-02 ENCOUNTER — Telehealth: Payer: Self-pay | Admitting: *Deleted

## 2015-11-02 ENCOUNTER — Ambulatory Visit (AMBULATORY_SURGERY_CENTER): Payer: Self-pay | Admitting: *Deleted

## 2015-11-02 VITALS — Ht 69.0 in | Wt 196.0 lb

## 2015-11-02 DIAGNOSIS — Z86010 Personal history of colonic polyps: Secondary | ICD-10-CM

## 2015-11-02 MED ORDER — NA SULFATE-K SULFATE-MG SULF 17.5-3.13-1.6 GM/177ML PO SOLN: 1.0000 | Freq: Once | ORAL | Status: DC

## 2015-11-02 NOTE — Telephone Encounter (Signed)
OK to proceed. He will have had more than enough time for his pneumonia to clear.

## 2015-11-02 NOTE — Telephone Encounter (Signed)
Dr Fuller Plan, I saw Mr Kallmeyer in [facility] today. He was diagnosed with pneumonia 1 week ago. He is on levaquin per his PCP, he had pneumonia 09-2014 as well.  He wanted to mention this and make sure it's okay to proceed with his colon 11-16-2015. He had a colon with you last 06-27-2010 with 1 polyp that was TA.  No cough, no fever, no chest pain, no SOB att he present time  Please advise, Thanks for your time Marijean Niemann

## 2015-11-02 NOTE — Telephone Encounter (Signed)
LM on voice mail that identifies pt by first and last name  for pt that Dr Fuller Plan said okay to proceed with procedure -  Instructed to call with questions Tom Richardson PV

## 2015-11-02 NOTE — Progress Notes (Signed)
No egg or soy allergy known to patient  No issues with past sedation with any surgeries  or procedures, no intubation problems  No diet pills per patient No home 02 use per patient  No blood thinners per patient  Pt denies issues with constipation   

## 2015-11-04 ENCOUNTER — Encounter: Payer: Self-pay | Admitting: Gastroenterology

## 2015-11-12 ENCOUNTER — Other Ambulatory Visit (INDEPENDENT_AMBULATORY_CARE_PROVIDER_SITE_OTHER): Payer: BLUE CROSS/BLUE SHIELD

## 2015-11-12 ENCOUNTER — Encounter: Payer: Self-pay | Admitting: Internal Medicine

## 2015-11-12 ENCOUNTER — Ambulatory Visit (INDEPENDENT_AMBULATORY_CARE_PROVIDER_SITE_OTHER)
Admission: RE | Admit: 2015-11-12 | Discharge: 2015-11-12 | Disposition: A | Discharge status: Home / Self Care | Payer: BLUE CROSS/BLUE SHIELD | Source: Ambulatory Visit | Attending: Internal Medicine | Admitting: Internal Medicine

## 2015-11-12 ENCOUNTER — Ambulatory Visit (INDEPENDENT_AMBULATORY_CARE_PROVIDER_SITE_OTHER): Payer: BLUE CROSS/BLUE SHIELD | Admitting: Internal Medicine

## 2015-11-12 VITALS — BP 140/70 | HR 87 | Ht 69.0 in | Wt 195.0 lb

## 2015-11-12 DIAGNOSIS — J479 Bronchiectasis, uncomplicated: Secondary | ICD-10-CM | POA: Insufficient documentation

## 2015-11-12 DIAGNOSIS — R918 Other nonspecific abnormal finding of lung field: Secondary | ICD-10-CM

## 2015-11-12 LAB — CBC WITH DIFFERENTIAL/PLATELET
BASOS ABS: 0 10*3/uL (ref 0.0–0.1)
Basophils Relative: 0.6 % (ref 0.0–3.0)
EOS ABS: 0.2 10*3/uL (ref 0.0–0.7)
Eosinophils Relative: 3.3 % (ref 0.0–5.0)
HCT: 40.1 % (ref 39.0–52.0)
Hemoglobin: 13.8 g/dL (ref 13.0–17.0)
LYMPHS ABS: 2 10*3/uL (ref 0.7–4.0)
LYMPHS PCT: 29.4 % (ref 12.0–46.0)
MCHC: 34.5 g/dL (ref 30.0–36.0)
MCV: 88.5 fl (ref 78.0–100.0)
MONOS PCT: 7.5 % (ref 3.0–12.0)
Monocytes Absolute: 0.5 10*3/uL (ref 0.1–1.0)
NEUTROS ABS: 4 10*3/uL (ref 1.4–7.7)
NEUTROS PCT: 59.2 % (ref 43.0–77.0)
PLATELETS: 286 10*3/uL (ref 150.0–400.0)
RBC: 4.54 Mil/uL (ref 4.22–5.81)
RDW: 13.4 % (ref 11.5–15.5)
WBC: 6.8 10*3/uL (ref 4.0–10.5)

## 2015-11-12 NOTE — Patient Instructions (Addendum)
Please remember to go to the lab and x-ray department downstairs for your tests - we will call you with the results when they are available.    Please see patient coordinator before you leave today  to schedule Dg Esophagram and sinus CT  GERD (REFLUX)  is an extremely common cause of respiratory symptoms just like yours , many times with no obvious heartburn at all.    It can be treated with medication, but also with lifestyle changes including elevation of the head of your bed (ideally with 6 inch  bed blocks),  Smoking cessation, avoidance of late meals, excessive alcohol, and avoid fatty foods, chocolate, peppermint, colas, red wine, and acidic juices such as orange juice.  NO MINT OR MENTHOL PRODUCTS SO NO COUGH DROPS  USE SUGARLESS CANDY INSTEAD (Jolley ranchers or Stover's or Life Savers) or even ice chips will also do - the key is to swallow to prevent all throat clearing. NO OIL BASED VITAMINS - use powdered substitutes.  Please remember to go to the lab and x-ray department downstairs for your tests - we will call you with the results when they are available.  If cough with green sputum recurs > repeat the levaquin course you have on hand  Please schedule a follow up office visit in 6 weeks, call sooner if needed  ? Needs hrct next ?

## 2015-11-12 NOTE — Progress Notes (Signed)
Quick Note:  lmtcb ______ 

## 2015-11-12 NOTE — Progress Notes (Addendum)
Subjective:    Patient ID: Tom Richardson, male    DOB: 11/30/1955,     MRN: [id number]  HPI  10 yowm never smoker multiple pna as child by HS was fine and then in his 30's head > bad chest cold frequently requiring ov's but many times resolving p several weeks then pna  08/2014 LLL but did not fully recover with lingering sensation of chest tightness then sick again June 2017 abruptly ill with cough green mucus > ER Eval > levaquin x 14 days back to baseline and refills [name].    11/12/2015 1st [facility] office visit/ Tom Richardson   Chief Complaint  Patient presents with  . Pulmonary Consult    Referred by [name], PA. Pt states he has had PNA multiple times since age 31, last time dxed on 10/27/15.  He has occ trouble swallowing.    last episode of pna 4/06/10/15 was post basal segment on L and no f/u cxr's prior to acutely worse again in mid June 2017 with fever and green sputum resolved p levaquin but CT chest 10/27/15 still not resolved, assoc with dysphagia and chronic nasal congestion as well  No obvious other patterns in day to day or daytime variabilty or assoc chronic  cp or ongoing chest tightness, subjective wheeze overt sinus or hb symptoms. No unusual exp hx or h/o childhood  asthma or knowledge of premature birth.  No dental issues  Sleeping ok without nocturnal  or early am exacerbation  of respiratory  c/o's or need for noct saba. Also denies any obvious fluctuation of symptoms with weather or environmental changes or other aggravating or alleviating factors except as outlined above   Current Medications, Allergies, Complete Past Medical History, Past Surgical History, Family History, and Social History were reviewed in [employer] record.           Review of Systems  Constitutional: Negative for fever, chills, activity change, appetite change and unexpected weight change.  HENT: Positive for congestion and trouble swallowing. Negative for  dental problem, postnasal drip, rhinorrhea, sneezing, sore throat and voice change.   Eyes: Negative for visual disturbance.  Respiratory: Positive for cough and shortness of breath. Negative for choking.   Cardiovascular: Negative for chest pain and leg swelling.  Gastrointestinal: Negative for nausea, vomiting and abdominal pain.  Genitourinary: Negative for difficulty urinating.  Musculoskeletal: Negative for arthralgias.  Skin: Negative for rash.  Psychiatric/Behavioral: Negative for behavioral problems and confusion.       Objective:   Physical Exam  amb wm nad   Wt Readings from Last 3 Encounters:  11/12/15 195 lb (88.451 kg)  11/02/15 196 lb (88.905 kg)  10/27/15 190 lb (86.183 kg)    Vital signs reviewed   HEENT: nl dentition, turbinates, and oropharynx. Nl external ear canals without cough reflex   NECK :  without JVD/Nodes/TM/ nl carotid upstrokes bilaterally   LUNGS: no acc muscle use,  Nl contour chest which is clear to A and P bilaterally without cough on insp or exp maneuvers   CV:  RRR  no s3 or murmur or increase in P2, no edema   ABD:  soft and nontender with nl inspiratory excursion in the supine position. No bruits or organomegaly, bowel sounds nl  MS:  Nl gait/ ext warm without deformities, calf tenderness, cyanosis or clubbing No obvious joint restrictions   SKIN: warm and dry without lesions    NEURO:  alert, approp, nl sensorium with  no motor deficits    CXR PA and Lateral:   11/12/2015 :    I personally reviewed images and agree with radiology impression as follows:    Left lower lobe is improved but not normal. Bronchial thickening and mild patchy lung density persists.  Labs 11/12/15  Cbc with diff/ Quant Ig's      Assessment & Plan:

## 2015-11-13 NOTE — Assessment & Plan Note (Signed)
See CXR 08/08/2015  See CT chest  10/27/15  Sinus CT 11/12/2015 >>>  UGI 11/12/2015 >>>  Immunoglobulins  11/12/2015 >>>  In this never smoker with h/o childhood pna he most likely has bronchiectasis that will show up a pna is completely resolved but only on HRCT - in meantime will w/u for other causes of recurrent pna like sinus dz or es dysfunction or hypoIg  .Total time devoted to counseling  = 35/38m review case with pt/ discussion of options/alternatives/ personally creating written instructions  in presence of pt  then going over those specific  Instructions directly with the pt including how to use all of the meds but in particular covering each new medication in detail and the difference between the maintenance/automatic meds and the prns using an action plan format for the latter.

## 2015-11-15 LAB — RESPIRATORY ALLERGY PROFILE REGION II ~~LOC~~
Allergen, Cedar tree, t12: <0.1 kU/L
Allergen, Comm Silver Birch, t9: <0.1 kU/L
Allergen, D pternoyssinus,d7: <0.1 kU/L
Allergen, Mouse Urine Protein, e78: <0.1 kU/L
Alternaria Alternata: <0.1 kU/L
Aspergillus fumigatus, m3: <0.1 kU/L
Box Elder IgE: <0.1 kU/L
Common Ragweed: <0.1 kU/L
IGE (IMMUNOGLOBULIN E), SERUM: 8 kU/L (ref <115)
Pecan/Hickory Tree IgE: <0.1 kU/L
Penicillium Notatum: <0.1 kU/L
Rough Pigweed  IgE: <0.1 kU/L
Sheep Sorrel IgE: <0.1 kU/L
Timothy Grass: <0.1 kU/L

## 2015-11-15 LAB — IGG, IGA, IGM
IgA: 168 mg/dL (ref 81–463)
IgG (Immunoglobin G), Serum: 687 mg/dL — ABNORMAL LOW (ref 694–1618)
IgM, Serum: 66 mg/dL (ref 48–271)

## 2015-11-16 ENCOUNTER — Ambulatory Visit (AMBULATORY_SURGERY_CENTER): Payer: BLUE CROSS/BLUE SHIELD | Admitting: Gastroenterology

## 2015-11-16 ENCOUNTER — Encounter: Payer: Self-pay | Admitting: Gastroenterology

## 2015-11-16 VITALS — BP 128/79 | HR 66 | Temp 98.4°F | Resp 13 | Ht 69.0 in | Wt 196.0 lb

## 2015-11-16 DIAGNOSIS — D122 Benign neoplasm of ascending colon: Secondary | ICD-10-CM

## 2015-11-16 DIAGNOSIS — K635 Polyp of colon: Secondary | ICD-10-CM

## 2015-11-16 DIAGNOSIS — Z86010 Personal history of colonic polyps: Secondary | ICD-10-CM | POA: Diagnosis present

## 2015-11-16 DIAGNOSIS — K621 Rectal polyp: Secondary | ICD-10-CM | POA: Diagnosis not present

## 2015-11-16 DIAGNOSIS — D128 Benign neoplasm of rectum: Secondary | ICD-10-CM

## 2015-11-16 DIAGNOSIS — D123 Benign neoplasm of transverse colon: Secondary | ICD-10-CM | POA: Diagnosis not present

## 2015-11-16 DIAGNOSIS — D124 Benign neoplasm of descending colon: Secondary | ICD-10-CM | POA: Diagnosis not present

## 2015-11-16 MED ORDER — SODIUM CHLORIDE 0.9 % IV SOLN: 500.0000 mL | INTRAVENOUS | Status: DC

## 2015-11-16 NOTE — Patient Instructions (Signed)
YOU HAD AN ENDOSCOPIC PROCEDURE TODAY AT [facility]:   Refer to the procedure report that was given to you for any specific questions about what was found during the examination.  If the procedure report does not answer your questions, please call your gastroenterologist to clarify.  If you requested that your care partner not be given the details of your procedure findings, then the procedure report has been included in a sealed envelope for you to review at your convenience later.  YOU SHOULD EXPECT: Some feelings of bloating in the abdomen. Passage of more gas than usual.  Walking can help get rid of the air that was put into your GI tract during the procedure and reduce the bloating. If you had a lower endoscopy (such as a colonoscopy or flexible sigmoidoscopy) you may notice spotting of blood in your stool or on the toilet paper. If you underwent a bowel prep for your procedure, you may not have a normal bowel movement for a few days.  Please Note:  You might notice some irritation and congestion in your nose or some drainage.  This is from the oxygen used during your procedure.  There is no need for concern and it should clear up in a day or so.  SYMPTOMS TO REPORT IMMEDIATELY:   Following lower endoscopy (colonoscopy or flexible sigmoidoscopy):  Excessive amounts of blood in the stool  Significant tenderness or worsening of abdominal pains  Swelling of the abdomen that is new, acute  Fever of 100F or higher  For urgent or emergent issues, a gastroenterologist can be reached at any hour by calling [phone].  DIET: Your first meal following the procedure should be a small meal and then it is ok to progress to your normal diet. Heavy or fried foods are harder to digest and may make you feel nauseous or bloated.  Likewise, meals heavy in dairy and vegetables can increase bloating.  Drink plenty of fluids but you should avoid alcoholic beverages for 24 hours.  ACTIVITY:   You should plan to take it easy for the rest of today and you should NOT DRIVE or use heavy machinery until tomorrow (because of the sedation medicines used during the test).    FOLLOW UP: Our staff will call the number listed on your records the next business day following your procedure to check on you and address any questions or concerns that you may have regarding the information given to you following your procedure. If we do not reach you, we will leave a message.  However, if you are feeling well and you are not experiencing any problems, there is no need to return our call.  We will assume that you have returned to your regular daily activities without incident.  If any biopsies were taken you will be contacted by phone or by letter within the next 1-3 weeks.  Please call us at [phone] if you have not heard about the biopsies in 3 weeks.   SIGNATURES/CONFIDENTIALITY: You and/or your care partner have signed paperwork which will be entered into your electronic medical record.  These signatures attest to the fact that that the information above on your After Visit Summary has been reviewed and is understood.  Full responsibility of the confidentiality of this discharge information lies with you and/or your care-partner.  Await pathology  Please continue your normal medications  Please read over handouts about polyps, diverticulosis, hemorrhoids and high fiber diets

## 2015-11-16 NOTE — Progress Notes (Signed)
Called to room to assist during endoscopic procedure.  Patient ID and intended procedure confirmed with present staff. Received instructions for my participation in the procedure from the performing physician.  

## 2015-11-16 NOTE — Progress Notes (Signed)
Quick Note:  Spoke with pt and notified of results per Dr. Wert. Pt verbalized understanding and denied any questions.  ______ 

## 2015-11-16 NOTE — Op Note (Signed)
Tom Richardson Patient Name: Tom Richardson Procedure Date: 11/16/2015 10:49 AM MRN: [id number] Endoscopist: Ladene Artist , MD Age: 60 Referring MD:  Date of Birth: 1955-10-31 Gender: Male Account #: [id number] Procedure:                Colonoscopy Indications:              Surveillance: Personal history of adenomatous                            polyps on last colonoscopy > 5 years ago Medicines:                Monitored Anesthesia Care Procedure:                Pre-Anesthesia Assessment:                           - Prior to the procedure, a History and Physical                            was performed, and patient medications and                            allergies were reviewed. The patient's tolerance of                            previous anesthesia was also reviewed. The risks                            and benefits of the procedure and the sedation                            options and risks were discussed with the patient.                            All questions were answered, and informed consent                            was obtained. Prior Anticoagulants: The patient has                            taken no previous anticoagulant or antiplatelet                            agents. ASA Grade Assessment: II - A patient with                            mild systemic disease. After reviewing the risks                            and benefits, the patient was deemed in                            satisfactory condition to undergo the procedure.  After obtaining informed consent, the colonoscope                            was passed under direct vision. Throughout the                            procedure, the patient's blood pressure, pulse, and                            oxygen saturations were monitored continuously. The                            Model PCF-H190L [id number]) scope was introduced                            through the anus and  advanced to the the cecum,                            identified by appendiceal orifice and ileocecal                            valve. The ileocecal valve, appendiceal orifice,                            and rectum were photographed. The quality of the                            bowel preparation was good. The colonoscopy was                            performed without difficulty. The patient tolerated                            the procedure well. Scope In: 10:58:10 AM Scope Out: 11:12:22 AM Scope Withdrawal Time: 0 hours 11 minutes 19 seconds  Total Procedure Duration: 0 hours 14 minutes 12 seconds  Findings:                 Three sessile polyps were found in the rectum,                            transverse colon and ascending colon. The polyps                            were 3 to 5 mm in size. These polyps were removed                            with a cold biopsy forceps. Resection and retrieval                            were complete.                           Internal hemorrhoids were found during  retroflexion. The hemorrhoids were small and Grade                            I (internal hemorrhoids that do not prolapse).                           The exam was otherwise without abnormality on                            direct and retroflexion views.                           Many small-mouthed diverticula were found in the                            sigmoid colon, descending colon and transverse                            colon. Complications:            No immediate complications. Estimated blood loss:                            None. Estimated Blood Loss:     Estimated blood loss: none. Impression:               - Three 3 to 5 mm polyps in the rectum, in the                            transverse colon and in the ascending colon,                            removed with a cold biopsy forceps. Resected and                            retrieved.                            - Internal hemorrhoids.                           - Diverticulosis in the sigmoid colon, in the                            descending colon and in the transverse colon. Recommendation:           - Repeat colonoscopy in 5 years for surveillance.                           - Patient has a contact number available for                            emergencies. The signs and symptoms of potential                            delayed complications were discussed with the  patient. Return to normal activities tomorrow.                            Written discharge instructions were provided to the                            patient.                           - High fiber diet.                           - Continue present medications.                           - Await pathology results. Ladene Artist, MD 11/16/2015 11:23:17 AM This report has been signed electronically.

## 2015-11-17 ENCOUNTER — Telehealth: Payer: Self-pay | Admitting: Gastroenterology

## 2015-11-17 ENCOUNTER — Telehealth: Payer: Self-pay

## 2015-11-17 MED ORDER — DICYCLOMINE HCL 20 MG PO TABS: 20.0000 mg | ORAL_TABLET | Freq: Four times a day (QID) | ORAL | Status: DC

## 2015-11-17 NOTE — Telephone Encounter (Signed)
Patient contacted.  He reports that the "I'm just a little sore"  " I am sure I will be all right".  He does not want to come for an office visit at this time.  "I work at a [employer] and I am at work, I am able to work at this point".  He declines evaluation, but does state he will call me if the pain is still present tomorrow.

## 2015-11-17 NOTE — Telephone Encounter (Signed)
Please contact the patient. May need urgent work in with APP today.

## 2015-11-17 NOTE — Telephone Encounter (Signed)
Patient reports that the pain has not improved since our conversations this am.  He reports that he feels he has a "possible low grade fever"  But he does not have a thermometer (he is still at work) .  Pain is localized on his left lower quadrant, "Just above my testicles".  He reports that he had 2 episodes of diarrhea after a meal today.  Denies bleeding.  He reports that the pain is improved with sitting or lying down.  He is s/p colonoscopy on 11/16/15 with polypectomy x 3, by cold forceps, from ascending and transverse colon.  He is asking what he should do from here?

## 2015-11-17 NOTE — Telephone Encounter (Signed)
It would have been best if he came in today as we recommended. Clear liquid diet tonight. Dicyclomine 20 mg po qid prn abd pain, #30, no refills. Tylenol 1-2 po q6h prn. Call back tomorrow with progress. If symptoms persist he needs to be seen. If symptoms worsen tonight go to ED.

## 2015-11-17 NOTE — Telephone Encounter (Signed)
Patient notified of the recommendations.  He verbalized understanding to go to the ED for worsening symptoms.

## 2015-11-17 NOTE — Telephone Encounter (Signed)
  Follow up Call-  Call back number 11/16/2015  Post procedure Call Back phone  # [phone]  Permission to leave phone message Yes     Patient questions:  Do you have a fever, pain , or abdominal swelling? Yes.   Pain Score  6 *  Have you tolerated food without any problems? Yes.    Have you been able to return to your normal activities? Yes.    Do you have any questions about your discharge instructions: Diet   No. Medications  No. Follow up visit  No  Do you have questions or concerns about your Care? No.  Actions: * If pain score is 4 or above: Physician/ provider Notified : Lucio Edward, MD.  Patient complaining of pain above his left testicle. Pain level is a 6, however he did go to work today and is hoping that the pain will get better as the day goes on. Patient states that he is eating and passing gas without difficulty. Patient also states that he had a bowel movement this morning.

## 2015-11-18 ENCOUNTER — Ambulatory Visit (HOSPITAL_COMMUNITY): Payer: BLUE CROSS/BLUE SHIELD

## 2015-11-18 ENCOUNTER — Ambulatory Visit (HOSPITAL_COMMUNITY): Admission: RE | Admit: 2015-11-18 | Payer: BLUE CROSS/BLUE SHIELD | Source: Ambulatory Visit

## 2015-11-18 ENCOUNTER — Emergency Department (HOSPITAL_COMMUNITY): Payer: BLUE CROSS/BLUE SHIELD

## 2015-11-18 ENCOUNTER — Encounter (HOSPITAL_COMMUNITY): Payer: Self-pay

## 2015-11-18 ENCOUNTER — Encounter: Payer: Self-pay | Admitting: Gastroenterology

## 2015-11-18 ENCOUNTER — Telehealth: Payer: Self-pay | Admitting: Internal Medicine

## 2015-11-18 ENCOUNTER — Emergency Department (HOSPITAL_COMMUNITY)
Admission: EM | Admit: 2015-11-18 | Discharge: 2015-11-18 | Disposition: A | Discharge status: Home / Self Care | Payer: BLUE CROSS/BLUE SHIELD | Attending: Emergency Medicine | Admitting: Emergency Medicine

## 2015-11-18 DIAGNOSIS — Z79899 Other long term (current) drug therapy: Secondary | ICD-10-CM | POA: Insufficient documentation

## 2015-11-18 DIAGNOSIS — R1032 Left lower quadrant pain: Secondary | ICD-10-CM

## 2015-11-18 DIAGNOSIS — K5732 Diverticulitis of large intestine without perforation or abscess without bleeding: Secondary | ICD-10-CM | POA: Insufficient documentation

## 2015-11-18 DIAGNOSIS — N189 Chronic kidney disease, unspecified: Secondary | ICD-10-CM | POA: Diagnosis not present

## 2015-11-18 LAB — COMPREHENSIVE METABOLIC PANEL
ALT: 26 U/L (ref 17–63)
ANION GAP: 5 (ref 5–15)
AST: 17 U/L (ref 15–41)
Albumin: 4 g/dL (ref 3.5–5.0)
Alkaline Phosphatase: 60 U/L (ref 38–126)
BUN: 14 mg/dL (ref 6–20)
CHLORIDE: 108 mmol/L (ref 101–111)
CO2: 24 mmol/L (ref 22–32)
Calcium: 8.5 mg/dL — ABNORMAL LOW (ref 8.9–10.3)
Creatinine, Ser: 0.74 mg/dL (ref 0.61–1.24)
Glucose, Bld: 111 mg/dL — ABNORMAL HIGH (ref 65–99)
POTASSIUM: 3.7 mmol/L (ref 3.5–5.1)
SODIUM: 137 mmol/L (ref 135–145)
Total Bilirubin: 1.2 mg/dL (ref 0.3–1.2)
Total Protein: 6.7 g/dL (ref 6.5–8.1)

## 2015-11-18 LAB — CBC WITH DIFFERENTIAL/PLATELET
Basophils Absolute: 0 10*3/uL (ref 0.0–0.1)
Basophils Relative: 0 %
EOS ABS: 0.2 10*3/uL (ref 0.0–0.7)
EOS PCT: 3 %
HCT: 39 % (ref 39.0–52.0)
Hemoglobin: 13.6 g/dL (ref 13.0–17.0)
Lymphocytes Relative: 18 %
Lymphs Abs: 1.4 10*3/uL (ref 0.7–4.0)
MCH: 30.7 pg (ref 26.0–34.0)
MCHC: 34.9 g/dL (ref 30.0–36.0)
MCV: 88 fL (ref 78.0–100.0)
MONO ABS: 0.7 10*3/uL (ref 0.1–1.0)
Monocytes Relative: 9 %
NEUTROS ABS: 5.4 10*3/uL (ref 1.7–7.7)
Neutrophils Relative %: 70 %
PLATELETS: 184 10*3/uL (ref 150–400)
RBC: 4.43 MIL/uL (ref 4.22–5.81)
RDW: 12.8 % (ref 11.5–15.5)
WBC: 7.7 10*3/uL (ref 4.0–10.5)

## 2015-11-18 LAB — LIPASE, BLOOD: LIPASE: 20 U/L (ref 11–51)

## 2015-11-18 MED ORDER — METRONIDAZOLE 500 MG PO TABS: 500.0000 mg | ORAL_TABLET | Freq: Three times a day (TID) | ORAL | Status: DC

## 2015-11-18 MED ORDER — KETOROLAC TROMETHAMINE 30 MG/ML IJ SOLN
15.0000 mg | Freq: Once | INTRAMUSCULAR | Status: AC
  Administered 2015-11-18: 15 mg via INTRAVENOUS
  Filled 2015-11-18: qty 1

## 2015-11-18 MED ORDER — CIPROFLOXACIN HCL 500 MG PO TABS: 500.0000 mg | ORAL_TABLET | Freq: Two times a day (BID) | ORAL | Status: DC

## 2015-11-18 MED ORDER — CIPROFLOXACIN HCL 250 MG PO TABS
500.0000 mg | ORAL_TABLET | Freq: Once | ORAL | Status: AC
  Administered 2015-11-18: 500 mg via ORAL
  Filled 2015-11-18: qty 2

## 2015-11-18 MED ORDER — ONDANSETRON HCL 4 MG/2ML IJ SOLN: 4.0000 mg | Freq: Once | INTRAMUSCULAR | Status: DC

## 2015-11-18 MED ORDER — IOPAMIDOL (ISOVUE-300) INJECTION 61%
100.0000 mL | Freq: Once | INTRAVENOUS | Status: AC | PRN
Start: 2015-11-18 — End: 2015-11-18
  Administered 2015-11-18: 100 mL via INTRAVENOUS

## 2015-11-18 MED ORDER — METRONIDAZOLE 500 MG PO TABS
500.0000 mg | ORAL_TABLET | Freq: Once | ORAL | Status: AC
  Administered 2015-11-18: 500 mg via ORAL
  Filled 2015-11-18: qty 1

## 2015-11-18 MED ORDER — DICYCLOMINE HCL 10 MG PO CAPS
10.0000 mg | ORAL_CAPSULE | Freq: Once | ORAL | Status: AC
  Administered 2015-11-18: 10 mg via ORAL
  Filled 2015-11-18: qty 1

## 2015-11-18 MED ORDER — MORPHINE SULFATE (PF) 4 MG/ML IV SOLN: 4.0000 mg | Freq: Once | INTRAVENOUS | Status: DC

## 2015-11-18 NOTE — ED Provider Notes (Signed)
Pt signed out to me by Dr. Dina Rich pending a CT abd/pelvis.  The CT showed acute diverticulitis.  No perforation.  Pt still does not want any narcotics for pain.  We will treat with Cipro and Flagyl.  Pt knows that he can return if worse and to f/u with GI.  Isla Pence, MD 11/18/15 [phone]

## 2015-11-18 NOTE — ED Notes (Signed)
Abdominal pain after having colonoscopy  2 days ago. Pt reports calling Dr Lynne Leader office to ask for advice on abd pain. Pt reports they called him in two Rx but pt does not know what they are and did not pick them up. Dr office advised to stay on clear liquid diet throughout the day yesterday, pt reports eating a piece of chicken, but otherwise liquids only.

## 2015-11-18 NOTE — Telephone Encounter (Signed)
Tom Richardson, This patient should have a follow up appt with me or an APP in a few weeks. Thx.

## 2015-11-18 NOTE — Telephone Encounter (Signed)
Contacted this morning while on call by Dr. Dina Rich at [facility]. Patient presented with persistent left-sided abdominal pain after colonoscopy 2 days ago with Dr. Fuller Plan Plain film unremarkable in ER. White count normal. Dr. Dina Rich stated he had abdominal tenderness in the left mid and lower abdomen without peritoneal signs. Given persistent pain we discussed CT scan of the abdomen and pelvis which will be pursued while in the ER I recommended consideration for empiric treatment for diverticulitis but she will follow-up CT scan results first I will alert Dr. Fuller Plan of this phone call   Addendum: I reviewed the CT scan results which show sigmoid diverticulitis. He will be treated appropriately and should follow-up with Dr. Fuller Plan for continuity

## 2015-11-18 NOTE — Discharge Instructions (Signed)
Diverticulitis °Diverticulitis is inflammation or infection of small pouches in your colon that form when you have a condition called diverticulosis. The pouches in your colon are called diverticula. Your colon, or large intestine, is where water is absorbed and stool is formed. °Complications of diverticulitis can include: °· Bleeding. °· Severe infection. °· Severe pain. °· Perforation of your colon. °· Obstruction of your colon. °CAUSES  °Diverticulitis is caused by bacteria. °Diverticulitis happens when stool becomes trapped in diverticula. This allows bacteria to grow in the diverticula, which can lead to inflammation and infection. °RISK FACTORS °People with diverticulosis are at risk for diverticulitis. Eating a diet that does not include enough fiber from fruits and vegetables may make diverticulitis more likely to develop. °SYMPTOMS  °Symptoms of diverticulitis may include: °· Abdominal pain and tenderness. The pain is normally located on the left side of the abdomen, but may occur in other areas. °· Fever and chills. °· Bloating. °· Cramping. °· Nausea. °· Vomiting. °· Constipation. °· Diarrhea. °· Blood in your stool. °DIAGNOSIS  °Your health care provider will ask you about your medical history and do a physical exam. You may need to have tests done because many medical conditions can cause the same symptoms as diverticulitis. Tests may include: °· Blood tests. °· Urine tests. °· Imaging tests of the abdomen, including X-rays and CT scans. °When your condition is under control, your health care provider may recommend that you have a colonoscopy. A colonoscopy can show how severe your diverticula are and whether something else is causing your symptoms. °TREATMENT  °Most cases of diverticulitis are mild and can be treated at home. Treatment may include: °· Taking over-the-counter pain medicines. °· Following a clear liquid diet. °· Taking antibiotic medicines by mouth for 7-10 days. °More severe cases may  be treated at a hospital. Treatment may include: °· Not eating or drinking. °· Taking prescription pain medicine. °· Receiving antibiotic medicines through an IV tube. °· Receiving fluids and nutrition through an IV tube. °· Surgery. °HOME CARE INSTRUCTIONS  °· Follow your health care provider's instructions carefully. °· Follow a full liquid diet or other diet as directed by your health care provider. After your symptoms improve, your health care provider may tell you to change your diet. He or she may recommend you eat a high-fiber diet. Fruits and vegetables are good sources of fiber. Fiber makes it easier to pass stool. °· Take fiber supplements or probiotics as directed by your health care provider. °· Only take medicines as directed by your health care provider. °· Keep all your follow-up appointments. °SEEK MEDICAL CARE IF:  °· Your pain does not improve. °· You have a hard time eating food. °· Your bowel movements do not return to normal. °SEEK IMMEDIATE MEDICAL CARE IF:  °· Your pain becomes worse. °· Your symptoms do not get better. °· Your symptoms suddenly get worse. °· You have a fever. °· You have repeated vomiting. °· You have bloody or black, tarry stools. °MAKE SURE YOU:  °· Understand these instructions. °· Will watch your condition. °· Will get help right away if you are not doing well or get worse. °  °This information is not intended to replace advice given to you by your health care provider. Make sure you discuss any questions you have with your health care provider. °  °Document Released: 02/01/2005 Document Revised: 04/29/2013 Document Reviewed: 03/19/2013 °Elsevier Interactive Patient Education ©2016 [employer] ° °

## 2015-11-18 NOTE — ED Provider Notes (Signed)
CSN: [id number]     Arrival date & time 11/18/15  0457 History   First MD Initiated Contact with Patient 11/18/15 [phone]     Chief Complaint  Patient presents with  . Abdominal Pain     (Consider location/radiation/quality/duration/timing/severity/associated sxs/prior Treatment) HPI  This is a 60 year old male who presents with abdominal pain. He had a colonoscopy 2 days ago by Dr. Fuller Plan. He states that since that time he has had constant left lower quadrant and mid abdominal pain. He states that it is sharp. Currently it is 4 out of 10. It is worse with certain movements. He has had several bowel movements since the colonoscopy. He denies any blood in his stools. He spoke with Dr. Lynne Leader office and they called him in a prescription; however, he did not pick this up. He has not taken anything for his pain. Denies any vomiting. States that he previously had a colonoscopy and did not experience any pain post procedure. He was instructed to be on a clear liquid diet but did eat a piece of chicken yesterday.  Past Medical History  Diagnosis Date  . Pneumonia     2 x in the last year   . Sleep apnea     wears cpap   . Adenomatous colon polyp 06-27-2010  . Chronic kidney disease   . Kidney stones    Past Surgical History  Procedure Laterality Date  . Hernia repair    . Hand surgery    . Colonoscopy    . Polypectomy     Family History  Problem Relation Age of Onset  . Colon polyps Neg Hx   . Colon cancer Neg Hx   . Esophageal cancer Neg Hx   . Rectal cancer Neg Hx   . Stomach cancer Neg Hx   . Heart disease Mother    Social History  Substance Use Topics  . Smoking status: Never Smoker   . Smokeless tobacco: Never Used  . Alcohol Use: No     Comment: former, "recovering alcoholic"    Review of Systems  Constitutional: Negative for fever.  Respiratory: Negative for shortness of breath.   Cardiovascular: Negative for chest pain.  Gastrointestinal: Positive for abdominal pain.  Negative for nausea, vomiting, diarrhea, constipation and blood in stool.  All other systems reviewed and are negative.     Allergies  Tetracycline  Home Medications   Prior to Admission medications   Medication Sig Start Date End Date Taking? Authorizing Provider  dicyclomine (BENTYL) 20 MG tablet Take 1 tablet (20 mg total) by mouth every 6 (six) hours. 11/17/15   Ladene Artist, MD   BP 124/90 mmHg  Pulse 60  Temp(Src) 97.9 F (36.6 C) (Oral)  Resp 16  Ht 5\' 9"  (1.753 m)  Wt 195 lb (88.451 kg)  BMI 28.78 kg/m2  SpO2 96% Physical Exam  Constitutional: He is oriented to person, place, and time. He appears well-developed and well-nourished.  HENT:  Head: Normocephalic and atraumatic.  Cardiovascular: Normal rate, regular rhythm and normal heart sounds.   No murmur heard. Pulmonary/Chest: Effort normal and breath sounds normal. No respiratory distress. He has no wheezes.  Abdominal: Soft. Bowel sounds are normal. There is tenderness. There is no rebound.  Tenderness palpation left lower and mid abdomen without rebound or guarding, normal bowel sounds  Lymphadenopathy:    He has no cervical adenopathy.  Neurological: He is alert and oriented to person, place, and time.  Skin: Skin is warm and dry.  Psychiatric: He has a normal mood and affect.  Nursing note and vitals reviewed.   ED Course  Procedures (including critical care time) Labs Review Labs Reviewed  COMPREHENSIVE METABOLIC PANEL - Abnormal; Notable for the following:    Glucose, Bld 111 (*)    Calcium 8.5 (*)    All other components within normal limits  CBC WITH DIFFERENTIAL/PLATELET  LIPASE, BLOOD    Imaging Review Dg Abd Acute W/chest  11/18/2015  CLINICAL DATA:  Severe lower abdominal pain after colonoscopy 2 days ago. EXAM: DG ABDOMEN ACUTE W/ 1V CHEST COMPARISON:  Chest 11/12/2015 FINDINGS: Normal heart size and pulmonary vascularity. Persistent localized infiltration or atelectasis in the left lung  base, similar to prior study. Right lung is clear. Azygos lobe. No pleural effusions. No pneumothorax. Scattered gas and stool throughout the colon. No small or large bowel distention. No free intra-abdominal air. No abnormal air-fluid levels. Calcified phleboliths in the pelvis. No radiopaque stones. Visualized bones appear intact. IMPRESSION: Focal infiltration or atelectasis in the left lung base similar to the prior study. Normal nonobstructive bowel gas pattern. No free air. Electronically Signed   By: Lucienne Capers M.D.   On: 11/18/2015 06:14   I have personally reviewed and evaluated these images and lab results as part of my medical decision-making.   EKG Interpretation None      MDM   Final diagnoses:  None    Patient presents with persistent left lower quadrant pain following colonoscopy. Nontoxic. Afebrile. Tender without signs of peritonitis. Patient refused narcotic pain medication. He was given a dose of Bentyl. Plain films show no evidence of free air. Basic labwork is reassuring. No leukocytosis. Patient was subsequently given a dose of Toradol. I discussed the patient with Dr. Hilarie Fredrickson, on call for Dr. Fuller Plan. Patient did have evidence of diverticula on his colonoscopy. Given the duration of pain, Dr. Hilarie Fredrickson recommend CT scan of the abdomen and pelvis to evaluate for microperforation and/or diverticulitis. Regardless, he recommends treating empirically for diverticulitis given location of pain and duration of symptoms.  CT pending.    Merryl Hacker, MD 11/18/15 [phone]

## 2015-11-18 NOTE — ED Notes (Signed)
Patient with no complaints at this time. Respirations even and unlabored. Skin warm/dry. Discharge instructions reviewed with patient at this time. Patient given opportunity to voice concerns/ask questions. IV removed per policy and band-aid applied to site. Patient discharged at this time and left Emergency Department with steady gait.  

## 2015-11-19 NOTE — Telephone Encounter (Signed)
Patient is scheduled for an office visit with Nicoletta Ba PA Patient is still having pain.  He is on antibiotics and will complete them next Friday

## 2015-11-25 ENCOUNTER — Encounter: Payer: Self-pay | Admitting: Physician Assistant

## 2015-11-25 ENCOUNTER — Ambulatory Visit (INDEPENDENT_AMBULATORY_CARE_PROVIDER_SITE_OTHER): Payer: BLUE CROSS/BLUE SHIELD | Admitting: Physician Assistant

## 2015-11-25 VITALS — BP 120/70 | HR 84 | Ht 67.0 in | Wt 194.2 lb

## 2015-11-25 DIAGNOSIS — K635 Polyp of colon: Secondary | ICD-10-CM | POA: Diagnosis not present

## 2015-11-25 DIAGNOSIS — K5732 Diverticulitis of large intestine without perforation or abscess without bleeding: Secondary | ICD-10-CM | POA: Diagnosis not present

## 2015-11-25 MED ORDER — METRONIDAZOLE 500 MG PO TABS: 500.0000 mg | ORAL_TABLET | Freq: Two times a day (BID) | ORAL | Status: DC

## 2015-11-25 MED ORDER — CIPROFLOXACIN HCL 500 MG PO TABS: 500.0000 mg | ORAL_TABLET | Freq: Two times a day (BID) | ORAL | Status: DC

## 2015-11-25 MED ORDER — CIPROFLOXACIN HCL 500 MG PO TABS: 500.0000 mg | ORAL_TABLET | Freq: Two times a day (BID) | ORAL | Status: AC

## 2015-11-25 MED ORDER — HYDROCORTISONE ACE-PRAMOXINE 1-1 % RE FOAM: 1.0000 | Freq: Two times a day (BID) | RECTAL | Status: DC

## 2015-11-25 NOTE — Progress Notes (Signed)
Patient ID: Tom Richardson, male   DOB: 21-Jul-1955, 60 y.o.   MRN: [id number]   Subjective:    Patient ID: Tom Richardson, male    DOB: Jul 28, 1955, 60 y.o.   MRN: [id number]  HPI Tom Richardson is a pleasant 60 year old white male known to Dr. Fuller Plan who underwent colonoscopy on 11/16/2015 for follow-up of adenomatous colon polyps. He was found to have 3 sessile polyps measuring 3-5 mm all were removed with cold biopsy in path is consistent with hyperplastic polyps. He was also noted to have many diverticuli in the transverse and left colon and internal hemorrhoids. Unfortunately patient developed acute abdominal pain the day following the procedure progressed throughout the day to the point that he went to the emergency room that evening for evaluation. He was having low-grade fevers and constant left-sided lower abdominal pain. He was seen in the ER at [facility] on 7/13  underwent CT of the abdomen and pelvis which confirmed sigmoid diverticulitis. CBC was normal at that time. He was started on a course of Cipro and Flagyl, after the ER physician there spoke to our physician on-call. He comes in today for follow-up. He is completing his seventh day of antibiotics today, and he says that he is about 95% better He says that his episode was extremely painful but has definitely significantly improved. He is not having any fever or chills, appetite is fine he is able to eat without difficulty, and he is having soft bowel movements without blood. He has never had diverticulitis previously and has a lot of questions.  Review of Systems Pertinent positive and negative review of systems were noted in the above HPI section.  All other review of systems was otherwise negative.  Outpatient Encounter Prescriptions as of 11/25/2015  Medication Sig  . ciprofloxacin (CIPRO) 500 MG tablet Take 1 tablet (500 mg total) by mouth 2 (two) times daily.  . metroNIDAZOLE (FLAGYL) 500 MG tablet Take 1 tablet (500 mg total) by  mouth 2 (two) times daily.  . [DISCONTINUED] ciprofloxacin (CIPRO) 500 MG tablet Take 1 tablet (500 mg total) by mouth 2 (two) times daily.  . [DISCONTINUED] ciprofloxacin (CIPRO) 500 MG tablet Take 1 tablet (500 mg total) by mouth 2 (two) times daily.  . [DISCONTINUED] metroNIDAZOLE (FLAGYL) 500 MG tablet Take 1 tablet (500 mg total) by mouth 3 (three) times daily.  . hydrocortisone-pramoxine (PROCTOFOAM HC) rectal foam Place 1 applicator rectally 2 (two) times daily. Apply 2-3 times dialy.  . [DISCONTINUED] dicyclomine (BENTYL) 20 MG tablet Take 1 tablet (20 mg total) by mouth every 6 (six) hours. (Patient not taking: Reported on 11/25/2015)   No facility-administered encounter medications on file as of 11/25/2015.   Allergies  Allergen Reactions  . Tetracycline     REACTION: rash   Patient Active Problem List   Diagnosis Date Noted  . Pulmonary infiltrates on CXR 11/12/2015   Social History   Social History  . Marital Status: Married    Spouse Name: N/A  . Number of Children: N/A  . Years of Education: N/A   Occupational History  . Not on file.   Social History Main Topics  . Smoking status: Never Smoker   . Smokeless tobacco: Never Used  . Alcohol Use: No     Comment: former, "recovering alcoholic"  . Drug Use: No  . Sexual Activity: Not on file   Other Topics Concern  . Not on file   Social History Narrative    Mr. Tom Richardson family  history includes Heart disease in his mother. There is no history of Colon polyps, Colon cancer, Esophageal cancer, Rectal cancer, or Stomach cancer.      Objective:    Filed Vitals:   11/25/15 1325  BP: 120/70  Pulse: 84    Physical Exam  well-developed white male in no acute distress, pleasant blood pressure 120/70 pulse 84 height 5 foot 7 weight 194 BMI 30. HEENT ;nontraumatic normocephalic EOMI PERRLA sclera anicteric, Cardiovascular ;regular rate and rhythm with S1-S2 no murmur or gallop, Pulmonary ;clear bilaterally, Abdomen  ;soft, bowel sounds are present he has some mild tenderness in the left lower quadrant there is no guarding or rebound no palpable mass or hepatosplenomegaly, Rectal ;exam not done, Extremities; no clubbing cyanosis or edema, Neuropsych; mood and affect appropriate     Assessment & Plan:   #51  60 year old white male with acute sigmoid diverticulitis onset within 24 hours of colonoscopy. He is improving that has not completely resolved diverticulitis as yet #2 history of adenomatous colon polyps #3 hyperplastic polyps with recent colonoscopy-plan is for 10 year follow-up  Plan; long discussion with patient regarding diverticulitis, management etc. I think he needs to complete another 7 days of Cipro 500 mg by mouth twice a day and Flagyl 500 mg by mouth twice a day High-fiber diet and add Benefiber once acute symptoms have resolved Patient asked for prescription for Proctofoam to use as needed for internal hemorrhoidal symptoms and this was sent He will follow-up with Dr. Fuller Plan or myself on an as-needed basis.   Kayston Jodoin S Kesley Gaffey PA-C 11/25/2015   Cc: Terald Sleeper, PA

## 2015-11-25 NOTE — Patient Instructions (Signed)
We sent prescriptions to [facility]. 1. Procotofoam 2. Flagyl ( metronidazole) 500 mg 3. Cipro 500 mg  We have given you information on Diverticulosis & Diverticulitis.

## 2015-11-28 NOTE — Progress Notes (Signed)
Reviewed and agree with management plan.  Kery Batzel T. Jerrelle Michelsen, MD FACG 

## 2015-12-01 ENCOUNTER — Ambulatory Visit (HOSPITAL_COMMUNITY): Admission: RE | Admit: 2015-12-01 | Payer: BLUE CROSS/BLUE SHIELD | Source: Ambulatory Visit

## 2015-12-01 ENCOUNTER — Other Ambulatory Visit (HOSPITAL_COMMUNITY): Payer: BLUE CROSS/BLUE SHIELD

## 2015-12-01 ENCOUNTER — Ambulatory Visit (HOSPITAL_COMMUNITY)
Admission: RE | Admit: 2015-12-01 | Discharge: 2015-12-01 | Disposition: A | Discharge status: Home / Self Care | Payer: BLUE CROSS/BLUE SHIELD | Source: Ambulatory Visit | Attending: Internal Medicine | Admitting: Internal Medicine

## 2015-12-01 DIAGNOSIS — R918 Other nonspecific abnormal finding of lung field: Secondary | ICD-10-CM

## 2015-12-01 DIAGNOSIS — J342 Deviated nasal septum: Secondary | ICD-10-CM | POA: Diagnosis not present

## 2015-12-01 DIAGNOSIS — R05 Cough: Secondary | ICD-10-CM | POA: Diagnosis present

## 2015-12-02 NOTE — Progress Notes (Signed)
Spoke with pt and notified of results per Dr. Wert. Pt verbalized understanding and denied any questions. 

## 2015-12-24 ENCOUNTER — Ambulatory Visit: Payer: BLUE CROSS/BLUE SHIELD | Admitting: Internal Medicine

## 2016-01-05 ENCOUNTER — Ambulatory Visit: Payer: BLUE CROSS/BLUE SHIELD

## 2016-02-05 ENCOUNTER — Encounter (HOSPITAL_COMMUNITY)
Admission: EM | Disposition: A | Discharge status: Home / Self Care | Payer: Self-pay | Source: Home / Self Care | Attending: Emergency Medicine

## 2016-02-05 ENCOUNTER — Encounter (HOSPITAL_COMMUNITY): Payer: Self-pay

## 2016-02-05 ENCOUNTER — Other Ambulatory Visit: Payer: Self-pay | Admitting: Orthopedic Surgery

## 2016-02-05 ENCOUNTER — Encounter (HOSPITAL_COMMUNITY): Payer: Self-pay | Admitting: Anesthesiology

## 2016-02-05 ENCOUNTER — Emergency Department (HOSPITAL_COMMUNITY)
Admission: EM | Admit: 2016-02-05 | Discharge: 2016-02-05 | Discharge status: Absent without leave | Payer: BLUE CROSS/BLUE SHIELD

## 2016-02-05 ENCOUNTER — Emergency Department (HOSPITAL_COMMUNITY): Payer: BLUE CROSS/BLUE SHIELD

## 2016-02-05 ENCOUNTER — Emergency Department (HOSPITAL_COMMUNITY)
Admission: EM | Admit: 2016-02-05 | Discharge: 2016-02-05 | Disposition: A | Discharge status: Home / Self Care | Payer: BLUE CROSS/BLUE SHIELD | Source: Home / Self Care | Attending: Emergency Medicine | Admitting: Emergency Medicine

## 2016-02-05 ENCOUNTER — Encounter (HOSPITAL_COMMUNITY): Payer: Self-pay | Admitting: Emergency Medicine

## 2016-02-05 ENCOUNTER — Emergency Department (HOSPITAL_COMMUNITY): Payer: BLUE CROSS/BLUE SHIELD | Admitting: Anesthesiology

## 2016-02-05 ENCOUNTER — Ambulatory Visit (HOSPITAL_COMMUNITY)
Admission: EM | Admit: 2016-02-05 | Discharge: 2016-02-05 | Disposition: A | Discharge status: Home / Self Care | Payer: BLUE CROSS/BLUE SHIELD | Attending: Emergency Medicine | Admitting: Emergency Medicine

## 2016-02-05 DIAGNOSIS — Y999 Unspecified external cause status: Secondary | ICD-10-CM

## 2016-02-05 DIAGNOSIS — S62635B Displaced fracture of distal phalanx of left ring finger, initial encounter for open fracture: Secondary | ICD-10-CM | POA: Insufficient documentation

## 2016-02-05 DIAGNOSIS — S68625A Partial traumatic transphalangeal amputation of left ring finger, initial encounter: Secondary | ICD-10-CM | POA: Diagnosis not present

## 2016-02-05 DIAGNOSIS — G4730 Sleep apnea, unspecified: Secondary | ICD-10-CM | POA: Diagnosis not present

## 2016-02-05 DIAGNOSIS — Z881 Allergy status to other antibiotic agents status: Secondary | ICD-10-CM | POA: Diagnosis not present

## 2016-02-05 DIAGNOSIS — Z86010 Personal history of colonic polyps: Secondary | ICD-10-CM | POA: Insufficient documentation

## 2016-02-05 DIAGNOSIS — W293XXA Contact with powered garden and outdoor hand tools and machinery, initial encounter: Secondary | ICD-10-CM | POA: Insufficient documentation

## 2016-02-05 DIAGNOSIS — N189 Chronic kidney disease, unspecified: Secondary | ICD-10-CM

## 2016-02-05 DIAGNOSIS — Z23 Encounter for immunization: Secondary | ICD-10-CM | POA: Insufficient documentation

## 2016-02-05 DIAGNOSIS — S68615A Complete traumatic transphalangeal amputation of left ring finger, initial encounter: Secondary | ICD-10-CM | POA: Diagnosis present

## 2016-02-05 DIAGNOSIS — T148XXA Other injury of unspecified body region, initial encounter: Secondary | ICD-10-CM

## 2016-02-05 DIAGNOSIS — Y939 Activity, unspecified: Secondary | ICD-10-CM

## 2016-02-05 DIAGNOSIS — Z8249 Family history of ischemic heart disease and other diseases of the circulatory system: Secondary | ICD-10-CM | POA: Diagnosis not present

## 2016-02-05 DIAGNOSIS — Y929 Unspecified place or not applicable: Secondary | ICD-10-CM | POA: Insufficient documentation

## 2016-02-05 DIAGNOSIS — W3189XA Contact with other specified machinery, initial encounter: Secondary | ICD-10-CM | POA: Insufficient documentation

## 2016-02-05 DIAGNOSIS — Z87442 Personal history of urinary calculi: Secondary | ICD-10-CM | POA: Insufficient documentation

## 2016-02-05 DIAGNOSIS — Y9389 Activity, other specified: Secondary | ICD-10-CM | POA: Diagnosis not present

## 2016-02-05 DIAGNOSIS — S61219A Laceration without foreign body of unspecified finger without damage to nail, initial encounter: Secondary | ICD-10-CM

## 2016-02-05 DIAGNOSIS — S62609B Fracture of unspecified phalanx of unspecified finger, initial encounter for open fracture: Secondary | ICD-10-CM

## 2016-02-05 HISTORY — PX: AMPUTATION: SHX166

## 2016-02-05 SURGERY — AMPUTATION DIGIT
Anesthesia: General | Laterality: Left

## 2016-02-05 MED ORDER — LIDOCAINE HCL (PF) 1 % IJ SOLN
INTRAMUSCULAR | Status: DC | PRN
  Administered 2016-02-05: 10 mL

## 2016-02-05 MED ORDER — TRAMADOL HCL 50 MG PO TABS: ORAL_TABLET | ORAL | 0 refills | Status: DC

## 2016-02-05 MED ORDER — MIDAZOLAM HCL 5 MG/5ML IJ SOLN
INTRAMUSCULAR | Status: DC | PRN
  Administered 2016-02-05: 2 mg via INTRAVENOUS

## 2016-02-05 MED ORDER — BUPIVACAINE HCL (PF) 0.25 % IJ SOLN
INTRAMUSCULAR | Status: AC
  Filled 2016-02-05: qty 30

## 2016-02-05 MED ORDER — LACTATED RINGERS IV SOLN
INTRAVENOUS | Status: DC | PRN
  Administered 2016-02-05: 19:00:00 via INTRAVENOUS

## 2016-02-05 MED ORDER — SUCCINYLCHOLINE CHLORIDE 20 MG/ML IJ SOLN
INTRAMUSCULAR | Status: DC | PRN
  Administered 2016-02-05: 100 mg via INTRAVENOUS

## 2016-02-05 MED ORDER — MIDAZOLAM HCL 2 MG/2ML IJ SOLN
INTRAMUSCULAR | Status: AC
  Filled 2016-02-05: qty 2

## 2016-02-05 MED ORDER — TETANUS-DIPHTH-ACELL PERTUSSIS 5-2.5-18.5 LF-MCG/0.5 IM SUSP
0.5000 mL | Freq: Once | INTRAMUSCULAR | Status: AC
  Administered 2016-02-05: 0.5 mL via INTRAMUSCULAR
  Filled 2016-02-05: qty 0.5

## 2016-02-05 MED ORDER — SULFAMETHOXAZOLE-TRIMETHOPRIM 800-160 MG PO TABS: 1.0000 | ORAL_TABLET | Freq: Two times a day (BID) | ORAL | 0 refills | Status: DC

## 2016-02-05 MED ORDER — CEFAZOLIN IN D5W 1 GM/50ML IV SOLN
INTRAVENOUS | Status: DC | PRN
  Administered 2016-02-05: 1 g via INTRAVENOUS

## 2016-02-05 MED ORDER — STERILE WATER FOR INJECTION IJ SOLN
INTRAMUSCULAR | Status: AC
  Filled 2016-02-05: qty 10

## 2016-02-05 MED ORDER — BUPIVACAINE HCL (PF) 0.5 % IJ SOLN
INTRAMUSCULAR | Status: AC
  Filled 2016-02-05: qty 30

## 2016-02-05 MED ORDER — ONDANSETRON HCL 4 MG/2ML IJ SOLN
INTRAMUSCULAR | Status: DC | PRN
  Administered 2016-02-05: 4 mg via INTRAVENOUS

## 2016-02-05 MED ORDER — FENTANYL CITRATE (PF) 250 MCG/5ML IJ SOLN
INTRAMUSCULAR | Status: DC | PRN
  Administered 2016-02-05: 100 ug via INTRAVENOUS

## 2016-02-05 MED ORDER — LIDOCAINE HCL (CARDIAC) 20 MG/ML IV SOLN
INTRAVENOUS | Status: DC | PRN
  Administered 2016-02-05: 60 mg via INTRATRACHEAL

## 2016-02-05 MED ORDER — CEFAZOLIN SODIUM 1 G IJ SOLR
INTRAMUSCULAR | Status: AC
  Filled 2016-02-05: qty 20

## 2016-02-05 MED ORDER — CEFAZOLIN SODIUM 1 G IJ SOLR
1.0000 g | Freq: Once | INTRAMUSCULAR | Status: AC
  Administered 2016-02-05: 1 g via INTRAMUSCULAR
  Filled 2016-02-05: qty 10

## 2016-02-05 MED ORDER — FENTANYL CITRATE (PF) 100 MCG/2ML IJ SOLN
INTRAMUSCULAR | Status: AC
  Filled 2016-02-05: qty 2

## 2016-02-05 MED ORDER — PROPOFOL 10 MG/ML IV BOLUS
INTRAVENOUS | Status: DC | PRN
  Administered 2016-02-05: 150 mg via INTRAVENOUS

## 2016-02-05 MED ORDER — HYDROMORPHONE HCL 1 MG/ML IJ SOLN: 0.2500 mg | INTRAMUSCULAR | Status: DC | PRN

## 2016-02-05 SURGICAL SUPPLY — 49 items
BLADE LONG MED 31X9 (MISCELLANEOUS) ×2 IMPLANT
BNDG CMPR 9X4 STRL LF SNTH (GAUZE/BANDAGES/DRESSINGS) ×1
BNDG COHESIVE 1X5 TAN STRL LF (GAUZE/BANDAGES/DRESSINGS) ×2 IMPLANT
BNDG COHESIVE 3X5 TAN STRL LF (GAUZE/BANDAGES/DRESSINGS) IMPLANT
BNDG ESMARK 4X9 LF (GAUZE/BANDAGES/DRESSINGS) ×2 IMPLANT
BNDG GAUZE ELAST 4 BULKY (GAUZE/BANDAGES/DRESSINGS) IMPLANT
CORDS BIPOLAR (ELECTRODE) ×2 IMPLANT
CUFF TOURNIQUET SINGLE 18IN (TOURNIQUET CUFF) ×2 IMPLANT
CUFF TOURNIQUET SINGLE 24IN (TOURNIQUET CUFF) IMPLANT
DRSG KUZMA FLUFF (GAUZE/BANDAGES/DRESSINGS) IMPLANT
DURAPREP 26ML APPLICATOR (WOUND CARE) ×1 IMPLANT
GAUZE SPONGE 4X4 12PLY STRL (GAUZE/BANDAGES/DRESSINGS) IMPLANT
GAUZE XEROFORM 1X8 LF (GAUZE/BANDAGES/DRESSINGS) ×2 IMPLANT
GLOVE BIO SURGEON STRL SZ7.5 (GLOVE) ×2 IMPLANT
GLOVE BIOGEL PI IND STRL 7.0 (GLOVE) IMPLANT
GLOVE BIOGEL PI IND STRL 8 (GLOVE) ×1 IMPLANT
GLOVE BIOGEL PI INDICATOR 7.0 (GLOVE) ×1
GLOVE BIOGEL PI INDICATOR 8 (GLOVE) ×1
GLOVE SURG SS PI 7.0 STRL IVOR (GLOVE) ×2 IMPLANT
GOWN STRL REUS W/ TWL LRG LVL3 (GOWN DISPOSABLE) ×1 IMPLANT
GOWN STRL REUS W/ TWL XL LVL3 (GOWN DISPOSABLE) ×1 IMPLANT
GOWN STRL REUS W/TWL LRG LVL3 (GOWN DISPOSABLE) ×2
GOWN STRL REUS W/TWL XL LVL3 (GOWN DISPOSABLE) ×2
KIT BASIN OR (CUSTOM PROCEDURE TRAY) ×2 IMPLANT
KIT ROOM TURNOVER OR (KITS) ×2 IMPLANT
MANIFOLD NEPTUNE II (INSTRUMENTS) ×1 IMPLANT
NDL HYPO 25GX1X1/2 BEV (NEEDLE) IMPLANT
NEEDLE HYPO 25GX1X1/2 BEV (NEEDLE) ×2 IMPLANT
NS IRRIG 1000ML POUR BTL (IV SOLUTION) ×2 IMPLANT
PACK ORTHO EXTREMITY (CUSTOM PROCEDURE TRAY) ×2 IMPLANT
PAD ARMBOARD 7.5X6 YLW CONV (MISCELLANEOUS) ×4 IMPLANT
PAD CAST 4YDX4 CTTN HI CHSV (CAST SUPPLIES) IMPLANT
PADDING CAST COTTON 4X4 STRL (CAST SUPPLIES)
RUBBERBAND STERILE (MISCELLANEOUS) ×1 IMPLANT
SPECIMEN JAR SMALL (MISCELLANEOUS) ×1 IMPLANT
SPLINT FINGER W/BULB (SOFTGOODS) ×1 IMPLANT
SPONGE GAUZE 4X4 12PLY STER LF (GAUZE/BANDAGES/DRESSINGS) ×1 IMPLANT
SPONGE SCRUB IODOPHOR (GAUZE/BANDAGES/DRESSINGS) ×2 IMPLANT
SUCTION FRAZIER HANDLE 10FR (MISCELLANEOUS) ×1
SUCTION TUBE FRAZIER 10FR DISP (MISCELLANEOUS) IMPLANT
SUT CHROMIC 6 0 PS 4 (SUTURE) ×1 IMPLANT
SUT ETHILON 5 0 PS 2 18 (SUTURE) IMPLANT
SUT MON AB 5-0 PS2 18 (SUTURE) ×1 IMPLANT
SUT SILK 4 0 PS 2 (SUTURE) IMPLANT
SYR CONTROL 10ML LL (SYRINGE) ×1 IMPLANT
TOWEL OR 17X24 6PK STRL BLUE (TOWEL DISPOSABLE) ×2 IMPLANT
TOWEL OR 17X26 10 PK STRL BLUE (TOWEL DISPOSABLE) ×2 IMPLANT
UNDERPAD 30X30 (UNDERPADS AND DIAPERS) ×2 IMPLANT
WATER STERILE IRR 1000ML POUR (IV SOLUTION) ×2 IMPLANT

## 2016-02-05 NOTE — Progress Notes (Signed)
Report given to Wells Guiles, CRNA

## 2016-02-05 NOTE — ED Notes (Signed)
Finger is wrapped up at this time. Dr. Tyrone Nine at bedside.

## 2016-02-05 NOTE — ED Notes (Signed)
Short stay is ready, 36, [id number]

## 2016-02-05 NOTE — ED Notes (Signed)
Spoke with Dr. Fredna Dow, he is aware of pt. Has arrived , he will be in shortly

## 2016-02-05 NOTE — Op Note (Signed)
[id number] 

## 2016-02-05 NOTE — H&P (Signed)
Tom Richardson is an 60 y.o. male.   Chief Complaint: left ring finger amputation HPI: 60 yo rhd male states he cut off part of the end of his left ring finger.  Seen at [facility] where XR revealed amputation of portion of the distal phalanx.  Sent to [facility] for further care.  He reports a throbbing pain of 7/10 severity before a digital block.  Alleviated by block and aggravated by palpation.  Case discussed with Caryl Ada, [facility] and her note from 02/05/2016 reviewed. Xrays viewed and interpreted by me: 3 views left ring finger show amputation of portion of distal phalanx.  No radioopaque foreign body noted. Labs reviewed: none  Allergies:  Allergies  Allergen Reactions  . Tetracycline     REACTION: rash    Past Medical History:  Diagnosis Date  . Adenomatous colon polyp 06-27-2010  . Chronic kidney disease   . Kidney stones   . Pneumonia    2 x in the last year   . Sleep apnea    wears cpap     Past Surgical History:  Procedure Laterality Date  . COLONOSCOPY    . HAND SURGERY    . HERNIA REPAIR    . POLYPECTOMY      Family History: Family History  Problem Relation Age of Onset  . Heart disease Mother   . Colon polyps Neg Hx   . Colon cancer Neg Hx   . Esophageal cancer Neg Hx   . Rectal cancer Neg Hx   . Stomach cancer Neg Hx     Social History:   reports that he has never smoked. He has never used smokeless tobacco. He reports that he does not drink alcohol or use drugs.  Medications:  (Not in a hospital admission)  No results found for this or any previous visit (from the past 48 hour(s)).  Dg Finger Ring Left  Result Date: 02/05/2016 CLINICAL DATA:  Left fourth digit laceration EXAM: LEFT RING FINGER 2+V COMPARISON:  None. FINDINGS: Soft tissue defect overlying the tuft of the left fourth finger. Amputation fracture deformity of the underlying tuft, with small adjacent avulsion fracture fragments. More proximal portion of the distal phalanx is intact and normally  aligned. IMPRESSION: Soft tissue defect/laceration overlying the tuft of the left fourth distal phalanx. Associated amputation fracture deformity of the underlying tuft. Electronically Signed   By: Franki Cabot M.D.   On: 02/05/2016 14:46     A comprehensive review of systems was negative. Review of Systems: No fevers, chills, night sweats, chest pain, shortness of breath, nausea, vomiting, diarrhea, constipation, easy bleeding or bruising, headaches, dizziness, vision changes, fainting.   Blood pressure 168/93, pulse 78, temperature 98.1 F (36.7 C), temperature source Oral, resp. rate 18, height 5\' 8"  (1.727 m), weight 86.2 kg (190 lb), SpO2 98 %.  General appearance: alert, cooperative and appears stated age Head: Normocephalic, without obvious abnormality, atraumatic Neck: supple, symmetrical, trachea midline Resp: clear to auscultation bilaterally Cardio: regular rate and rhythm GI: non-tender Extremities: Intact sensation and capillary refill all digits.  +epl/fpl/io.  Amputation of ulnar portion of tip of finger with exposed bone.  Loss of nail bed tissue. Pulses: 2+ and symmetric Skin: Skin color, texture, turgor normal. 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  Discussed nature of injury with patient and family member.  Recommend revision amputation.  Discussed shortening finger and having more rounded contour vs maintaining length  and having a slope to end of finger.  He wishes to maintain length.  Risks, benefits, and alternatives of surgery were discussed and the patient agrees with the plan of care.   Catelyn Friel R 02/05/2016, 6:25 PM

## 2016-02-05 NOTE — Discharge Instructions (Signed)

## 2016-02-05 NOTE — Transfer of Care (Signed)
Immediate Anesthesia Transfer of Care Note  Patient: Tom Richardson  Procedure(s) Performed: Procedure(s): REVISION OF LEFT RING FINGER AMPUTATION (Left)  Patient Location: PACU  Anesthesia Type:General  Level of Consciousness: awake  Airway & Oxygen Therapy: Patient Spontanous Breathing  Post-op Assessment: Report given to RN and Post -op Vital signs reviewed and stable  Post vital signs: Reviewed and stable  Last Vitals:  Vitals:   02/05/16 1350  BP: 168/93  Pulse: 78  Resp: 18  Temp: 36.7 C    Last Pain:  Vitals:   02/05/16 1557  TempSrc:   PainSc: 0-No pain         Complications: No apparent anesthesia complications

## 2016-02-05 NOTE — Progress Notes (Signed)
D; Pt is not in [facility] system when admitted in PACU after surgery, await fix the admition process. Unable to chart right time.  @ [phone]

## 2016-02-05 NOTE — ED Triage Notes (Addendum)
Patient has wound to left ring finger involving removal of tip of finger and nail bed. Per patient using hedge trimers today and caught left finger.

## 2016-02-05 NOTE — ED Provider Notes (Signed)
[facility] DEPT Provider Note   CSN: [id number] Arrival date & time: 02/05/16  1341     History   Chief Complaint Chief Complaint  Patient presents with  . Finger Injury    HPI Tom Richardson is a 60 y.o. male.  The history is provided by the patient. A language interpreter was used.  Hand Pain  This is a new problem. The current episode started 1 to 2 hours ago. The problem occurs constantly. The problem has been gradually worsening. Nothing aggravates the symptoms. Nothing relieves the symptoms. He has tried nothing for the symptoms. The treatment provided no relief.  Pt cut left ring finger with a gasoline powered hedge clipper.  Pt needs a tetanus  Past Medical History:  Diagnosis Date  . Adenomatous colon polyp 06-27-2010  . Chronic kidney disease   . Kidney stones   . Pneumonia    2 x in the last year   . Sleep apnea    wears cpap     Patient Active Problem List   Diagnosis Date Noted  . Pulmonary infiltrates on CXR 11/12/2015    Past Surgical History:  Procedure Laterality Date  . COLONOSCOPY    . HAND SURGERY    . HERNIA REPAIR    . POLYPECTOMY         Home Medications    Prior to Admission medications   Not on File    Family History Family History  Problem Relation Age of Onset  . Heart disease Mother   . Colon polyps Neg Hx   . Colon cancer Neg Hx   . Esophageal cancer Neg Hx   . Rectal cancer Neg Hx   . Stomach cancer Neg Hx     Social History Social History  Substance Use Topics  . Smoking status: Never Smoker  . Smokeless tobacco: Never Used  . Alcohol use No     Comment: former, "recovering alcoholic"     Allergies   Tetracycline   Review of Systems Review of Systems  All other systems reviewed and are negative.    Physical Exam Updated Vital Signs BP 168/93 (BP Location: Left Arm)   Pulse 78   Temp 98.1 F (36.7 C) (Oral)   Resp 18   Ht 5\' 8"  (1.727 m)   Wt 86.2 kg   SpO2 98%   BMI 28.89 kg/m    Physical Exam  Constitutional: He appears well-developed and well-nourished.  Musculoskeletal: He exhibits tenderness.  Avulsion laceration left ring finger,  Distal tip on nail edge is scooped out.  Bone exposed,  nv and nd intact,  Missing skin flap  Neurological: He is alert.  Skin: Skin is warm.  Nursing note and vitals reviewed.    ED Treatments / Results  Labs (all labs ordered are listed, but only abnormal results are displayed) Labs Reviewed - No data to display  EKG  EKG Interpretation None       Radiology Dg Finger Ring Left  Result Date: 02/05/2016 CLINICAL DATA:  Left fourth digit laceration EXAM: LEFT RING FINGER 2+V COMPARISON:  None. FINDINGS: Soft tissue defect overlying the tuft of the left fourth finger. Amputation fracture deformity of the underlying tuft, with small adjacent avulsion fracture fragments. More proximal portion of the distal phalanx is intact and normally aligned. IMPRESSION: Soft tissue defect/laceration overlying the tuft of the left fourth distal phalanx. Associated amputation fracture deformity of the underlying tuft. Electronically Signed   By: Franki Cabot M.D.   On:  02/05/2016 14:46    Procedures Procedures (including critical care time)  Medications Ordered in ED Medications  bupivacaine (MARCAINE) 0.5 % injection (not administered)  Tdap (BOOSTRIX) injection 0.5 mL (0.5 mLs Intramuscular Given 02/05/16 1400)     Initial Impression / Assessment and Plan / ED Course  I have reviewed the triage vital signs and the nursing notes.  Pertinent labs & imaging results that were available during my care of the patient were reviewed by me and considered in my medical decision making (see chart for details).  Clinical Course    Digital block 5%marcaine,  Wound cleaned,  Pt given tetanus,    Call Dr. Fredna Dow paged.  He will see pt at [facility] for evaluation Pt given Ancef 1 gram IM  Final Clinical Impressions(s) / ED Diagnoses    Final diagnoses:  Fracture of finger, left, open, initial encounter    New Prescriptions New Prescriptions   No medications on file     Fransico Meadow, PA-C 02/05/16 [place], PA-C 02/05/16 1543    Noemi Chapel, MD 02/05/16 [phone]

## 2016-02-05 NOTE — ED Notes (Signed)
Spoke with Marcene Brawn, PA from [facility] ,  Dr. Fredna Dow is to be paged when pt. Arrives.  Spoke with [place] B to page Dr. Fredna Dow

## 2016-02-05 NOTE — Anesthesia Procedure Notes (Signed)
Procedure Name: Intubation Date/Time: 02/05/2016 7:02 PM Performed by: Maude Leriche D Pre-anesthesia Checklist: Patient identified, Emergency Drugs available, Suction available, Patient being monitored and Timeout performed Patient Re-evaluated:Patient Re-evaluated prior to inductionOxygen Delivery Method: Circle system utilized Preoxygenation: Pre-oxygenation with 100% oxygen Intubation Type: IV induction, Rapid sequence and Cricoid Pressure applied Laryngoscope Size: Miller and 2 Grade View: Grade I Tube type: Oral Tube size: 7.5 mm Number of attempts: 1 Airway Equipment and Method: Stylet Placement Confirmation: ETT inserted through vocal cords under direct vision,  positive ETCO2 and breath sounds checked- equal and bilateral Secured at: 22 cm Tube secured with: Tape Dental Injury: Teeth and Oropharynx as per pre-operative assessment

## 2016-02-05 NOTE — ED Provider Notes (Addendum)
  Physical Exam  BP 138/85   Pulse 75   Temp 98.5 F (36.9 C) (Oral)   Resp 16   SpO2 99%   Physical Exam  ED Course  Procedures  MDM 60 yo M Transferred here to be seen by the hand surgeon. Patient had cut the tip off of his left ring finger with head tremors. X-ray with some concern for open fractures and here for hand surgery eval and likely repair.   1. Finger laceration, initial encounter   2. Open fracture        Deno Etienne, DO 02/05/16 [place], DO 02/05/16 2039

## 2016-02-05 NOTE — ED Notes (Signed)
Pt soaking left tip of ring finger in saline and betadine

## 2016-02-05 NOTE — Anesthesia Postprocedure Evaluation (Signed)
Anesthesia Post Note  Patient: SULTAN KETTNER  Procedure(s) Performed: Procedure(s) (LRB): REVISION OF LEFT RING FINGER AMPUTATION (Left)  Patient location during evaluation: PACU Anesthesia Type: General Level of consciousness: awake and alert Pain management: pain level controlled Vital Signs Assessment: post-procedure vital signs reviewed and stable Respiratory status: spontaneous breathing, nonlabored ventilation and respiratory function stable Cardiovascular status: blood pressure returned to baseline and stable Postop Assessment: no signs of nausea or vomiting Anesthetic complications: no    Last Vitals:  Vitals:   02/05/16 2015 02/05/16 2030  BP: 150/96 153/95  Pulse: 69 74  Resp: 16 15  Temp:      Last Pain:  Vitals:   02/05/16 1557  TempSrc:   PainSc: 0-No pain                 Naveena Eyman,W. EDMOND

## 2016-02-05 NOTE — Progress Notes (Signed)
D; discharged from PACU, [place] dc instruction given to Pt and wife meds prescription handed to wife by Dr. Fredna Dow.DC by wheelchair. Wife at bedside.@ 2116 02/05/2016

## 2016-02-05 NOTE — Anesthesia Preprocedure Evaluation (Addendum)
Anesthesia Evaluation  Patient identified by MRN, date of birth, ID band Patient awake    Reviewed: Allergy & Precautions, H&P , NPO status , Patient's Chart, lab work & pertinent test results  Airway Mallampati: II  TM Distance: >3 FB Neck ROM: Full    Dental no notable dental hx. (+) Teeth Intact, Dental Advisory Given   Pulmonary sleep apnea and Continuous Positive Airway Pressure Ventilation ,    Pulmonary exam normal breath sounds clear to auscultation       Cardiovascular negative cardio ROS   Rhythm:Regular Rate:Normal     Neuro/Psych negative neurological ROS  negative psych ROS   GI/Hepatic negative GI ROS, Neg liver ROS,   Endo/Other  negative endocrine ROS  Renal/GU negative Renal ROS  negative genitourinary   Musculoskeletal   Abdominal   Peds  Hematology negative hematology ROS (+)   Anesthesia Other Findings   Reproductive/Obstetrics negative OB ROS                            Anesthesia Physical Anesthesia Plan  ASA: III  Anesthesia Plan: General   Post-op Pain Management:    Induction: Intravenous, Rapid sequence and Cricoid pressure planned  Airway Management Planned: Oral ETT  Additional Equipment:   Intra-op Plan:   Post-operative Plan: Extubation in OR  Informed Consent: I have reviewed the patients History and Physical, chart, labs and discussed the procedure including the risks, benefits and alternatives for the proposed anesthesia with the patient or authorized representative who has indicated his/her understanding and acceptance.   Dental advisory given  Plan Discussed with: CRNA, Surgeon and Anesthesiologist  Anesthesia Plan Comments:       Anesthesia Quick Evaluation

## 2016-02-05 NOTE — ED Triage Notes (Signed)
Pt sent here from [facility] for surgery with Dr. Fredna Dow.  Onset this morning  Pt cut tip of finger off on left ring finger, bone exposed.

## 2016-02-05 NOTE — Brief Op Note (Signed)
02/05/2016  7:41 PM  PATIENT:  Tom Richardson  60 y.o. male  PRE-OPERATIVE DIAGNOSIS:  Partial amputation of left ring finger  POST-OPERATIVE DIAGNOSIS:  Partial amputation of left ring finger  PROCEDURE:  Procedure(s): REVISION OF LEFT RING FINGER AMPUTATION (Left)  SURGEON:  Surgeon(s) and Role:    * Leanora Cover, MD - Primary  PHYSICIAN ASSISTANT:   ASSISTANTS: none   ANESTHESIA:   general  EBL:  Total I/O In: 800 [I.V.:800] Out: -   BLOOD ADMINISTERED:none  DRAINS: none   LOCAL MEDICATIONS USED:  MARCAINE     SPECIMEN:  No Specimen  DISPOSITION OF SPECIMEN:  N/A  COUNTS:  YES  TOURNIQUET:   Total Tourniquet Time Documented: Upper Arm (Left) - 29 minutes Total: Upper Arm (Left) - 29 minutes   DICTATION: .Other Dictation: Dictation Number [id number]  PLAN OF CARE: Discharge to home after PACU  PATIENT DISPOSITION:  PACU - hemodynamically stable.

## 2016-02-06 ENCOUNTER — Encounter (HOSPITAL_COMMUNITY): Payer: Self-pay | Admitting: Orthopedic Surgery

## 2016-02-06 NOTE — Op Note (Deleted)
  The note originally documented on this encounter has been moved the the encounter in which it belongs.  

## 2016-02-06 NOTE — Op Note (Signed)
[name], [name]               ACCOUNT NO.:  [id number]  MEDICAL RECORD NO.:  [id number]  LOCATION:                                 FACILITY:  PHYSICIAN:  Tom Cover, MD        DATE OF BIRTH:  [birth year]  DATE OF PROCEDURE:  02/05/2016 DATE OF DISCHARGE:  02/05/2016                              OPERATIVE REPORT   PREOPERATIVE DIAGNOSIS:  Left ring fingertip amputation.  POSTOPERATIVE DIAGNOSIS:  Left ring fingertip amputation.  PROCEDURE:  Revision amputation of left ring fingertip through distal phalanx.  SURGEON:  Tom Cover, MD  ASSISTANT:  None.  ANESTHESIA:  General.  IV FLUIDS:  Per anesthesia flow sheet.  ESTIMATED BLOOD LOSS:  Minimal.  COMPLICATIONS:  None.  SPECIMENS:  None.  TIME OF TOURNIQUET:  29 minutes.  DISPOSITION:  Stable to PACU.  INDICATIONS:  Tom Richardson is a 60 year old right-hand dominant male, who states he cut the end of his left ring fingertip off with hedge clippers.  He was seen at the [facility] Emergency Department, where radiographs were taken revealing distal phalanx partial amputation.  He was referred to me for further care.  He was transferred to [facility] for my care.  I recommended revision amputation in the operating room.  Risks, benefits, and alternative surgery were discussed including risk of blood loss; infection; damage to nerves, vessels, tendons, ligaments, bone; failure of surgery; need for additional surgery; complications with wound healing; continued pain; and nail deformity.  He voiced understanding of these risks and elected to proceed.  OPERATIVE COURSE:  After being identified preoperatively by myself, the patient and I agreed upon procedure and site of procedure.  Surgical site was marked.  The risks, benefits, and alternatives of surgery were reviewed and he wished to proceed.  Surgical consent had been signed. He was given IV Ancef as preoperative antibiotic prophylaxis.  He had been given Ancef and tetanus  updated at [facility] Emergency Department. He was transferred to the operating room and placed on the operating room table in supine position with left upper extremity on arm board. General anesthesia induced by Anesthesiology.  Left upper extremity was prepped and draped in normal sterile orthopedic fashion.  Surgical pause was performed between surgeons, anesthesia, and operating staff; and all were in agreement to the patient, procedure, and site of procedure. Tourniquet at the proximal aspect of the extremity was inflated to 250 mmHg after exsanguination of the limb with Esmarch bandage.  The wound was explored.  There was a small amount of gross contamination with a black gritty substance.  This was removed as best possible.  This was minimal contamination.  The distal aspect of the distal phalanx was just a shell of bone fragments.  These were sharply removed with the scalpel. They did not provide any structural support.  The more radial sided flap of the palmar skin was able to be brought over the end of the bone.  The radial side of the bone was rongeured back couple of mm to remove the spike of bone.  This provided a good contour at the end of the fingertip.  The wound was copiously irrigated with sterile  saline by bulb syringe.  The skin was reapproximated using 5-0 Monocryl suture in an interrupted fashion.  A dog ear at the radial side was removed and the wound closed.  The nail bed was then reapproximated with 6-0 chromic suture.  Some of the remaining distal radial nail bed was able to be placed at the distal aspect centrally.  The radial side skin was then incised to allow a course for the nail to grow out.  A piece of Xeroform was placed in nail fold and the wound dressed with sterile Xeroform and 4 x 4, and wrapped with a Coban dressing lightly.  Alumafoam splint was placed and wrapped lightly with Coban dressing.  Tourniquet was deflated at 29 minutes.  Fingertips were  pink with brisk capillary refill after deflation of tourniquet.  The operative drapes were broken down.  The patient was awakened from anesthesia safely.  He was transferred back to stretcher and taken to PACU in stable condition.  I will see him back in the office in 1 week for postoperative followup.  He would like to use tramadol for pain.  I will give him a prescription for tramadol 50 mg 1- 2 p.o. q.6 hours p.r.n. pain dispensed #30.     Tom Cover, MD   ______________________________ Tom Cover, MD    KK/MEDQ  D:  02/05/2016  T:  02/06/2016  Job:  [id number]

## 2016-04-04 ENCOUNTER — Encounter: Payer: Self-pay | Admitting: Internal Medicine

## 2016-04-04 ENCOUNTER — Ambulatory Visit (INDEPENDENT_AMBULATORY_CARE_PROVIDER_SITE_OTHER): Payer: BLUE CROSS/BLUE SHIELD | Admitting: Internal Medicine

## 2016-04-04 VITALS — BP 132/80 | HR 92 | Temp 99.0°F | Ht 68.0 in | Wt 192.0 lb

## 2016-04-04 DIAGNOSIS — R918 Other nonspecific abnormal finding of lung field: Secondary | ICD-10-CM

## 2016-04-04 MED ORDER — TRAMADOL HCL 50 MG PO TABS: ORAL_TABLET | ORAL | 0 refills | Status: DC

## 2016-04-04 NOTE — Progress Notes (Signed)
Subjective:    Patient ID: Tom Richardson, male    DOB: 03-26-1956,     MRN: [id number]     Brief patient profile:  69 yowm never smoker multiple pna as child by HS was fine and then in his 30's head > bad chest cold frequently requiring ov's but many times resolving p several weeks then pna  08/2014 LLL but did not fully recover with lingering sensation of chest tightness then sick again June 2017 abruptly ill with cough green mucus > ER Eval > levaquin x 14 days back to baseline and refills Tom Richardson.   History of Present Illness  11/12/2015 1st [facility] office visit/ [facility]   Chief Complaint  Patient presents with  . Pulmonary Consult    Referred by Tom Nearing, PA. Pt states he has had PNA multiple times since age 60, last time dxed on 10/27/15.  He has occ trouble swallowing.    last episode of pna 4/06/10/15 was post basal segment on L and no f/u cxr's prior to acutely worse again in mid June 2017 with fever and green sputum resolved p levaquin but CT chest 10/27/15 still not resolved, assoc with dysphagia and chronic nasal congestion as well rec Please remember to go to the lab and x-ray department downstairs for your tests - we will call you with the results when they are available. schedule Dg Esophagram and sinus CT> neg  GERD diet  If cough with green sputum recurs > repeat the levaquin course you have on hand Please schedule a follow up office visit in 6 weeks, call sooner if needed > did not return        04/04/2016  f/u ov/Wert re: ? Recurrent pna Chief Complaint  Patient presents with  . Acute Visit    Pt c/o chills, aches, and congestion for the past 4 days.   started levaquin 500 x 04/02/16 but no better yet, sputum still dark yellow esp in am  but no cp or sob  No obvious day to day or daytime variability or assoc mucus plugs or hemoptysis or cp or chest tightness, subjective wheeze or overt sinus or hb symptoms. No unusual exp hx or h/o childhood pna/ asthma  or knowledge of premature birth.  Sleeping ok without nocturnal  or early am exacerbation  of respiratory  c/o's or need for noct saba. Also denies any obvious fluctuation of symptoms with weather or environmental changes or other aggravating or alleviating factors except as outlined above   Current Medications, Allergies, Complete Past Medical History, Past Surgical History, Family History, and Social History were reviewed in [employer] record.  ROS  The following are not active complaints unless bolded sore throat, dysphagia, dental problems, itching, sneezing,  nasal congestion or excess/ purulent secretions, ear ache,   fever, chills, sweats, unintended wt loss, classically pleuritic or exertional cp,  orthopnea pnd or leg swelling, presyncope, palpitations, abdominal pain, anorexia, nausea, vomiting, diarrhea  or change in bowel or bladder habits, change in stools or urine, dysuria,hematuria,  rash, arthralgias, visual complaints, headache, numbness, weakness or ataxia or problems with walking or coordination,  change in mood/affect or memory.                 Objective:   Physical Exam  amb wm nad    04/04/2016     192   11/12/15 195 lb (88.451 kg)  11/02/15 196 lb (88.905 kg)  10/27/15 190 lb (86.183 kg)  Vital signs reviewed - Note on arrival 02 sats  97%  on  RA  HEENT: nl dentition, turbinates, and oropharynx. Nl external ear canals without cough reflex   NECK :  without JVD/Nodes/TM/ nl carotid upstrokes bilaterally   LUNGS: no acc muscle use,  Nl contour chest which is clear to A and P bilaterally without cough on insp or exp maneuvers   CV:  RRR  no s3 or murmur or increase in P2, no edema   ABD:  soft and nontender with nl inspiratory excursion in the supine position. No bruits or organomegaly, bowel sounds nl  MS:  Nl gait/ ext warm without deformities, calf tenderness, cyanosis or clubbing No obvious joint restrictions   SKIN: warm  and dry without lesions    NEURO:  alert, approp, nl sensorium with  no motor deficits    CXR PA and Lateral:   11/12/2015 :    I personally reviewed images and agree with radiology impression as follows:    Left lower lobe is improved but not normal. Bronchial thickening and mild patchy lung density persists.        Assessment & Plan:

## 2016-04-04 NOTE — Patient Instructions (Addendum)
Change levaquin to 750 mg daily until gone   Please see patient coordinator before you leave today  to schedule CT Chest in 10days (no sooner)    For cough > mucinex dm 1200 mg every 12 hours and if still coughing add tramadol 50 mg one every 4 hours as needed

## 2016-04-05 NOTE — Assessment & Plan Note (Signed)
See CXR 08/08/2015  See CT chest  10/27/15  Sinus CT 12/01/2015 > 1. Mucosal thickening in the frontal sinus, some ethmoid air cells, and the maxillary sinuses. No air-fluid level. 2. Minimally prominent nasal turbinates and slight nasal septal deviation to the left. UGI 12/01/2015 > Normal barium esophagram.  Immunoglobulins  11/12/2015 > nl / IgE 8 neg RAST   Hx is strongly suggestive of bronchiectasis but need HRCT to confirm so rx acutely with high dose levaquin then proceed with HRCT  I had an extended discussion with the patient reviewing all relevant studies completed to date and  lasting 15 to 20 minutes of a 25 minute visit    Each maintenance medication was reviewed in detail including most importantly the difference between maintenance and prns and under what circumstances the prns are to be triggered using an action plan format that is not reflected in the computer generated alphabetically organized AVS.    Please see AVS for unique instructions that I personally wrote and verbalized to the the pt in detail and then reviewed with pt  by my nurse highlighting any  changes in therapy recommended at today's visit to their plan of care.

## 2016-04-14 ENCOUNTER — Ambulatory Visit (INDEPENDENT_AMBULATORY_CARE_PROVIDER_SITE_OTHER)
Admission: RE | Admit: 2016-04-14 | Discharge: 2016-04-14 | Disposition: A | Discharge status: Home / Self Care | Payer: BLUE CROSS/BLUE SHIELD | Source: Ambulatory Visit | Attending: Internal Medicine | Admitting: Internal Medicine

## 2016-04-14 DIAGNOSIS — R918 Other nonspecific abnormal finding of lung field: Secondary | ICD-10-CM

## 2016-04-17 NOTE — Progress Notes (Signed)
Spoke with pt and notified of results per Dr. Wert. Pt verbalized understanding and denied any questions. 

## 2016-05-02 ENCOUNTER — Ambulatory Visit (INDEPENDENT_AMBULATORY_CARE_PROVIDER_SITE_OTHER): Payer: BLUE CROSS/BLUE SHIELD | Admitting: Internal Medicine

## 2016-05-02 ENCOUNTER — Encounter: Payer: Self-pay | Admitting: Internal Medicine

## 2016-05-02 ENCOUNTER — Other Ambulatory Visit: Payer: BLUE CROSS/BLUE SHIELD

## 2016-05-02 VITALS — BP 152/82 | HR 82 | Ht 68.0 in | Wt 196.6 lb

## 2016-05-02 DIAGNOSIS — R918 Other nonspecific abnormal finding of lung field: Secondary | ICD-10-CM | POA: Diagnosis not present

## 2016-05-02 DIAGNOSIS — J479 Bronchiectasis, uncomplicated: Secondary | ICD-10-CM | POA: Diagnosis not present

## 2016-05-02 MED ORDER — LEVOFLOXACIN 750 MG PO TABS: 750.0000 mg | ORAL_TABLET | Freq: Every day | ORAL | 11 refills | Status: DC

## 2016-05-02 NOTE — Patient Instructions (Addendum)
Bronchiectasis =   you have scarring of your bronchial tubes which means that they don't function perfectly normally and mucus tends to pool in certain areas of your lung which can cause pneumonia and further scarring of your lung and bronchial tubes  Whenever you develop cough congestion take mucinex or mucinex dm > these will help keep the mucus loose and flowing but if your condition worsens you need to seek help immediately preferably here or somewhere inside the [facility] system to compare xrays ( worse = darker or bloody mucus or pain on breathing in)    If mucus gets really nasty > levaquin 750 mg 5-10 days   Pneumovax followed 5 years later by another   Prevnar 13 one and done a year apart from the other two  Yearly flu vaccines   Please remember to go to the lab  department downstairs for your tests - we will call you with the results when they are available.     Please schedule a follow up visit in 3 months but call sooner if needed with pfts

## 2016-05-02 NOTE — Progress Notes (Signed)
Subjective:    Patient ID: Tom Richardson, male    DOB: July 16, 1955,     MRN: [id number]     Brief patient profile:  60 yowm never smoker multiple pna as child by HS was fine and then in his 30's head > bad chest cold frequently requiring ov's but many times resolving p several weeks then pna  08/2014 LLL but did not fully recover with lingering sensation of chest tightness then sick again June 2017 abruptly ill with cough green mucus > ER Eval > levaquin x 14 days back to baseline and referred to pulmonary clinic 05/02/2016 by Dr   [name] dx as bronchiectasis by HRCT 04/14/16    History of Present Illness  11/12/2015 1st [facility] office visit/ [facility]   Chief Complaint  Patient presents with  . Pulmonary Consult    Referred by [name], PA. Pt states he has had PNA multiple times since age 60, last time dxed on 10/27/15.  He has occ trouble swallowing.    last episode of pna 4/06/10/15 was post basal segment on L and no f/u cxr's prior to acutely worse again in mid June 2017 with fever and green sputum resolved p levaquin but CT chest 10/27/15 still not resolved, assoc with dysphagia and chronic nasal congestion as well rec Please remember to go to the lab and x-ray department downstairs for your tests - we will call you with the results when they are available. schedule Dg Esophagram and sinus CT> neg  GERD diet  If cough with green sputum recurs > repeat the levaquin course you have on hand Please schedule a follow up office visit in 6 weeks, call sooner if needed > did not return        04/04/2016  f/u ov/Kyl Givler re: ? Recurrent pna Chief Complaint  Patient presents with  . Acute Visit    Pt c/o chills, aches, and congestion for the past 4 days.   started levaquin 500 x 04/02/16 but no better yet, sputum still dark yellow esp in am  but no cp or sob rec Change levaquin to 750 mg daily until gone  Pease see patient coordinator before you leave today  to schedule CT Chest in  10 days (no sooner)  For cough > mucinex dm 1200 mg every 12 hours and if still coughing add tramadol 50 mg one every 4 hours as needed     05/02/2016  f/u ov/Irma Roulhac re: bronchiectasis / new dx Chief Complaint  Patient presents with  . Follow-up    discuss CT results, pt feels good     Not limited by breathing from desired activities     No obvious day to day or daytime variability or assoc mucus plugs or hemoptysis or cp or chest tightness, subjective wheeze or overt sinus or hb symptoms. No unusual exp hx or h/o childhood pna/ asthma or knowledge of premature birth.  Sleeping ok without nocturnal  or early am exacerbation  of respiratory  c/o's or need for noct saba. Also denies any obvious fluctuation of symptoms with weather or environmental changes or other aggravating or alleviating factors except as outlined above   Current Medications, Allergies, Complete Past Medical History, Past Surgical History, Family History, and Social History were reviewed in [employer] record.  ROS  The following are not active complaints unless bolded sore throat, dysphagia, dental problems, itching, sneezing,  nasal congestion or excess/ purulent secretions, ear ache,   fever, chills, sweats,  unintended wt loss, classically pleuritic or exertional cp,  orthopnea pnd or leg swelling, presyncope, palpitations, abdominal pain, anorexia, nausea, vomiting, diarrhea  or change in bowel or bladder habits, change in stools or urine, dysuria,hematuria,  rash, arthralgias, visual complaints, headache, numbness, weakness or ataxia or problems with walking or coordination,  change in mood/affect or memory.                 Objective:   Physical Exam  amb wm nad   05/02/2016      196   04/04/2016     192   11/12/15 195 lb (88.451 kg)  11/02/15 196 lb (88.905 kg)  10/27/15 190 lb (86.183 kg)    Vital signs reviewed - Note on arrival 02 sats  97%  on  RA  HEENT: nl dentition,  turbinates, and oropharynx. Nl external ear canals without cough reflex   NECK :  without JVD/Nodes/TM/ nl carotid upstrokes bilaterally   LUNGS: no acc muscle use,  Nl contour chest which is clear to A and P bilaterally without cough on insp or exp maneuvers   CV:  RRR  no s3 or murmur or increase in P2, no edema   ABD:  soft and nontender with nl inspiratory excursion in the supine position. No bruits or organomegaly, bowel sounds nl  MS:  Nl gait/ ext warm without deformities, calf tenderness, cyanosis or clubbing No obvious joint restrictions   SKIN: warm and dry without lesions    NEURO:  alert, approp, nl sensorium with  no motor deficits       I personally reviewed images and agree with radiology impression as follows:  CT HRCT Chest   04/14/16  1. Very mild diffuse cylindrical bronchiectasis. This is associated with some very mild peribronchovascular predominant ground-glass attenuation which likely reflects underlying inflammation. 2. No definite imaging findings to suggest interstitial lung disease. 3. Aortic atherosclerosis,         Assessment & Plan:

## 2016-05-03 ENCOUNTER — Telehealth: Payer: Self-pay | Admitting: Internal Medicine

## 2016-05-03 NOTE — Telephone Encounter (Signed)
MW made aware and Prevnar 13 added to his immunizations tab

## 2016-05-04 LAB — ALPHA-1-ANTITRYPSIN: A1 ANTITRYPSIN SER: 152 mg/dL (ref 83–199)

## 2016-05-05 LAB — ALPHA-1 ANTITRYPSIN PHENOTYPE: A-1 Antitrypsin: 157 mg/dL (ref 83–199)

## 2016-05-08 NOTE — Assessment & Plan Note (Addendum)
See CXR 08/08/2015  See CT chest  10/27/15  Sinus CT 12/01/2015 > 1. Mucosal thickening in the frontal sinus, some ethmoid air cells, and the maxillary sinuses. No air-fluid level. 2. Minimally prominent nasal turbinates and slight nasal septal deviation to the left. UGI 12/01/2015 > Normal barium esophagram.  Immunoglobulins  11/12/2015 > nl / IgE 8 neg RAST - HRCT 04/14/16 1. Very mild diffuse cylindrical bronchiectasis. This is associated with some very mild peribronchovascular predominant ground-glass attenuation which likely reflects underlying inflammation s PF  - alpha one AT screen 05/02/2016   MM nl levels    I had an extended discussion with the patient reviewing all relevant studies completed to date and  lasting 15 to 20 minutes of a 25 minute visit on the following ongoing concerns:   1) pathophysiology/ management reviewed - ok to use short course levaquin prn flare - 5 days of levaquin 750 and take 5 more if mucus still purulent   2) relevance of findings of coronary atherosclerosis discussed   3) /fu pfts rec  Each maintenance medication was reviewed in detail including most importantly the difference between maintenance and as needed and under what circumstances the prns are to be used.  Please see AVS for  customized  Instructions which were reviewed in detail in writing with the patient and a copy provided.

## 2016-05-22 ENCOUNTER — Ambulatory Visit (INDEPENDENT_AMBULATORY_CARE_PROVIDER_SITE_OTHER): Payer: BLUE CROSS/BLUE SHIELD | Admitting: Physician Assistant

## 2016-05-22 ENCOUNTER — Encounter: Payer: Self-pay | Admitting: Physician Assistant

## 2016-05-22 VITALS — BP 157/95 | HR 74 | Temp 97.7°F | Ht 68.0 in | Wt 195.6 lb

## 2016-05-22 DIAGNOSIS — Z23 Encounter for immunization: Secondary | ICD-10-CM | POA: Diagnosis not present

## 2016-05-22 DIAGNOSIS — Z0000 Encounter for general adult medical examination without abnormal findings: Secondary | ICD-10-CM | POA: Insufficient documentation

## 2016-05-22 DIAGNOSIS — R7989 Other specified abnormal findings of blood chemistry: Secondary | ICD-10-CM

## 2016-05-22 DIAGNOSIS — Z87442 Personal history of urinary calculi: Secondary | ICD-10-CM

## 2016-05-22 DIAGNOSIS — J479 Bronchiectasis, uncomplicated: Secondary | ICD-10-CM

## 2016-05-22 DIAGNOSIS — I2510 Atherosclerotic heart disease of native coronary artery without angina pectoris: Secondary | ICD-10-CM | POA: Insufficient documentation

## 2016-05-22 DIAGNOSIS — G4733 Obstructive sleep apnea (adult) (pediatric): Secondary | ICD-10-CM | POA: Diagnosis not present

## 2016-05-22 DIAGNOSIS — Z1159 Encounter for screening for other viral diseases: Secondary | ICD-10-CM

## 2016-05-22 NOTE — Progress Notes (Signed)
BP (!) 157/95   Pulse 74   Temp 97.7 F (36.5 C) (Oral)   Ht 5' 8" (1.727 m)   Wt 195 lb 9.6 oz (88.7 kg)   BMI 29.74 kg/m    Subjective:    Patient ID: Tom Richardson, male    DOB: [birth year], 61 y.o.   MRN: [id number]  Tom Richardson is a 61 y.o. male presenting on 05/22/2016 for Establish Care and Follow-up  HPI Patient here to be established as new patient at [facility].  This patient is known to me from [facility]. This patient comes in for periodic recheck on medications and conditions. All medications are reviewed today. There are no reports of any problems with the medications. All of the medical conditions are reviewed and updated.  Lab work is reviewed and will be ordered as medically necessary.   We have discussed his CT result from the pulmonologist. He was found to have bronchiectasis. This is the cause for him to have recurrent severe bronchitis or pneumonia. He was concerned about his sleep apnea mask being infectious. He knows that he doesn't wash it as often as he should. At this time he has not been using it. He declines any severe symptoms. We have discussed the possibility of him using a dental appliance to see if this will help. He can speak with this dentist about that.  Past Medical History:  Diagnosis Date  . Adenomatous colon polyp 06-27-2010  . Chronic kidney disease   . Kidney stones   . Pneumonia    2 x in the last year   . Sleep apnea    wears cpap    Relevant past medical, surgical, family and social history reviewed and updated as indicated. Interim medical history since our last visit reviewed. Allergies and medications reviewed and updated.   Data reviewed from any sources in EPIC.  Review of Systems  Constitutional: Negative.  Negative for appetite change and fatigue.  HENT: Negative.   Eyes: Negative.  Negative for pain and visual disturbance.  Respiratory: Negative.  Negative for cough, chest tightness,  shortness of breath and wheezing.   Cardiovascular: Negative.  Negative for chest pain, palpitations and leg swelling.  Gastrointestinal: Negative.  Negative for abdominal pain, diarrhea, nausea and vomiting.  Endocrine: Negative.   Genitourinary: Negative.   Musculoskeletal: Negative.   Skin: Negative.  Negative for color change and rash.  Neurological: Negative.  Negative for weakness, numbness and headaches.  Psychiatric/Behavioral: Negative.      Social History   Social History  . Marital status: Married    Spouse name: N/A  . Number of children: N/A  . Years of education: N/A   Occupational History  . Not on file.   Social History Main Topics  . Smoking status: Never Smoker  . Smokeless tobacco: Never Used  . Alcohol use No     Comment: former, "recovering alcoholic"  . Drug use: No  . Sexual activity: Not on file   Other Topics Concern  . Not on file   Social History Narrative  . No narrative on file    Past Surgical History:  Procedure Laterality Date  . AMPUTATION Left 02/05/2016   Procedure: REVISION OF LEFT RING FINGER AMPUTATION;  Surgeon: Leanora Cover, MD;  Location: [facility];  Service: Orthopedics;  Laterality: Left;  . COLONOSCOPY    . HAND SURGERY    . HERNIA REPAIR    . POLYPECTOMY  Family History  Problem Relation Age of Onset  . Heart disease Mother   . Colon polyps Neg Hx   . Colon cancer Neg Hx   . Esophageal cancer Neg Hx   . Rectal cancer Neg Hx   . Stomach cancer Neg Hx     Allergies as of 05/22/2016      Reactions   Tetracycline    REACTION: rash      Medication List    as of 05/22/2016  1:44 PM   You have not been prescribed any medications.        Objective:    BP (!) 157/95   Pulse 74   Temp 97.7 F (36.5 C) (Oral)   Ht 5' 8" (1.727 m)   Wt 195 lb 9.6 oz (88.7 kg)   BMI 29.74 kg/m   Allergies  Allergen Reactions  . Tetracycline     REACTION: rash   Wt Readings from Last 3 Encounters:  05/22/16 195 lb 9.6  oz (88.7 kg)  05/02/16 196 lb 9.6 oz (89.2 kg)  04/04/16 192 lb (87.1 kg)    Physical Exam  Constitutional: He appears well-developed and well-nourished.  HENT:  Head: Normocephalic and atraumatic.  Eyes: Conjunctivae and EOM are normal. Pupils are equal, round, and reactive to light.  Neck: Normal range of motion. Neck supple.  Cardiovascular: Normal rate, regular rhythm and normal heart sounds.   Pulmonary/Chest: Effort normal and breath sounds normal.  Abdominal: Soft. Bowel sounds are normal.  Musculoskeletal: Normal range of motion.  Skin: Skin is warm and dry.  Nursing note and vitals reviewed.   Results for orders placed or performed in visit on 05/02/16  Alpha-1 antitrypsin phenotype  Result Value Ref Range   A-1 Antitrypsin 157 83 - 199 mg/dL   Results Received SEE NOTE   Alpha-1-antitrypsin  Result Value Ref Range   A-1 Antitrypsin, Ser 152 83 - 199 mg/dL      Assessment & Plan:   1. Bronchiectasis without complication (HCC) Follow Dr. Wert's recommendation.  Levaquin at the time of infection.  2. Obstructive sleep apnea Consider weight loss, nasal strips and speak with dentist about appliance  3. History of kidney stones  4. Atherosclerosis of coronary artery of native heart without angina pectoris, unspecified vessel or lesion type - CBC with Differential/Platelet - CMP14+EGFR - Lipid panel  5. Well adult exam - CBC with Differential/Platelet - CMP14+EGFR - Lipid panel - PSA  6. Need for hepatitis C screening test - Hepatitis C antibody  7. Encounter for immunization - CBC with Differential/Platelet - CMP14+EGFR - Lipid panel - PSA - Hepatitis C antibody - Flu Vaccine QUAD 36+ mos IM  8. Need for pneumococcal vaccination - Pneumococcal polysaccharide vaccine 23-valent greater than or equal to 2yo subcutaneous/IM   Continue all other maintenance medications as listed above. Educational handout given for DASH diet  Follow up plan: Return  in about 6 months (around 11/19/2016), or if symptoms worsen or fail to improve.   S.  PA-C [facility] [address]  [place], [place] [address] [phone]   05/22/2016, 1:44 PM       

## 2016-05-22 NOTE — Patient Instructions (Addendum)
Cholesterol Cholesterol is a fat. Your body needs a small amount of cholesterol. Cholesterol (plaque) may build up in your blood vessels (arteries). That makes you more likely to have a heart attack or stroke. You cannot feel your cholesterol level. Having a blood test is the only way to find out if your level is high. Keep your test results. Work with your doctor to keep your cholesterol at a good level. What do the results mean?  Total cholesterol is how much cholesterol is in your blood.  LDL is bad cholesterol. This is the type that can build up. Try to have low LDL.  HDL is good cholesterol. It cleans your blood vessels and carries LDL away. Try to have high HDL.  Triglycerides are fat that the body can store or burn for energy. What are good levels of cholesterol?  Total cholesterol below 200.  LDL below 100 is good for people who have health risks. LDL below 70 is good for people who have very high risks.  HDL above 40 is good. It is best to have HDL of 60 or higher.  Triglycerides below 150. How can I lower my cholesterol? Diet  Follow your diet program as told by your doctor.  Choose fish, white meat chicken, or [place] that is roasted or baked. Try not to eat red meat, fried foods, sausage, or lunch meats.  Eat lots of fresh fruits and vegetables.  Choose whole grains, beans, pasta, potatoes, and cereals.  Choose olive oil, corn oil, or canola oil. Only use small amounts.  Try not to eat butter, mayonnaise, shortening, or palm kernel oils.  Try not to eat foods with trans fats.  Choose low-fat or nonfat dairy foods.  Drink skim or nonfat milk.  Eat low-fat or nonfat yogurt and cheeses.  Try not to drink whole milk or cream.  Try not to eat ice cream, egg yolks, or full-fat cheeses.  Healthy desserts include Markela Wee food cake, ginger snaps, animal crackers, hard candy, popsicles, and low-fat or nonfat frozen yogurt. Try not to eat pastries, cakes, pies, and  cookies. Exercise  Follow your exercise program as told by your doctor.  Be more active. Try gardening, walking, and taking the stairs.  Ask your doctor about ways that you can be more active. Medicine  Take over-the-counter and prescription medicines only as told by your doctor. This information is not intended to replace advice given to you by your health care provider. Make sure you discuss any questions you have with your health care provider. Document Released: 07/21/2008 Document Revised: 11/24/2015 Document Reviewed: 11/04/2015 Elsevier Interactive Patient Education  2017 [employer] DASH stands for "Dietary Approaches to Stop Hypertension." The DASH eating plan is a healthy eating plan that has been shown to reduce high blood pressure (hypertension). Additional health benefits may include reducing the risk of type 2 diabetes mellitus, heart disease, and stroke. The DASH eating plan may also help with weight loss. What do I need to know about the DASH eating plan? For the DASH eating plan, you will follow these general guidelines:  Choose foods with less than 150 milligrams of sodium per serving (as listed on the food label).  Use salt-free seasonings or herbs instead of table salt or sea salt.  Check with your health care provider or pharmacist before using salt substitutes.  Eat lower-sodium products. These are often labeled as "low-sodium" or "no salt added."  Eat fresh foods. Avoid eating a lot of canned foods.  Eat more vegetables, fruits, and low-fat dairy products.  Choose whole grains. Look for the word "whole" as the first word in the ingredient list.  Choose fish and skinless chicken or [place] more often than red meat. Limit fish, poultry, and meat to 6 oz (170 g) each day.  Limit sweets, desserts, sugars, and sugary drinks.  Choose heart-healthy fats.  Eat more home-cooked food and less restaurant, buffet, and fast food.  Limit fried  foods.  Do not fry foods. Cook foods using methods such as baking, boiling, grilling, and broiling instead.  When eating at a restaurant, ask that your food be prepared with less salt, or no salt if possible. What foods can I eat? Seek help from a dietitian for individual calorie needs. Grains  Whole grain or whole wheat bread. Brown rice. Whole grain or whole wheat pasta. Quinoa, bulgur, and whole grain cereals. Low-sodium cereals. Corn or whole wheat flour tortillas. Whole grain cornbread. Whole grain crackers. Low-sodium crackers. Vegetables  Fresh or frozen vegetables (raw, steamed, roasted, or grilled). Low-sodium or reduced-sodium tomato and vegetable juices. Low-sodium or reduced-sodium tomato sauce and paste. Low-sodium or reduced-sodium canned vegetables. Fruits  All fresh, canned (in natural juice), or frozen fruits. Meat and Other Protein Products  Ground beef (85% or leaner), grass-fed beef, or beef trimmed of fat. Skinless chicken or [place]. Ground chicken or [place]. Pork trimmed of fat. All fish and seafood. Eggs. Dried beans, peas, or lentils. Unsalted nuts and seeds. Unsalted canned beans. Dairy  Low-fat dairy products, such as skim or 1% milk, 2% or reduced-fat cheeses, low-fat ricotta or cottage cheese, or plain low-fat yogurt. Low-sodium or reduced-sodium cheeses. Fats and Oils  Tub margarines without trans fats. Light or reduced-fat mayonnaise and salad dressings (reduced sodium). Avocado. Safflower, olive, or canola oils. Natural peanut or almond butter. Other  Unsalted popcorn and pretzels. The items listed above may not be a complete list of recommended foods or beverages. Contact your dietitian for more options.  What foods are not recommended? Grains  White bread. White pasta. White rice. Refined cornbread. Bagels and croissants. Crackers that contain trans fat. Vegetables  Creamed or fried vegetables. Vegetables in a cheese sauce. Regular canned vegetables. Regular  canned tomato sauce and paste. Regular tomato and vegetable juices. Fruits  Canned fruit in light or heavy syrup. Fruit juice. Meat and Other Protein Products  Fatty cuts of meat. Ribs, chicken wings, bacon, sausage, bologna, salami, chitterlings, fatback, hot dogs, bratwurst, and packaged luncheon meats. Salted nuts and seeds. Canned beans with salt. Dairy  Whole or 2% milk, cream, half-and-half, and cream cheese. Whole-fat or sweetened yogurt. Full-fat cheeses or blue cheese. Nondairy creamers and whipped toppings. Processed cheese, cheese spreads, or cheese curds. Condiments  Onion and garlic salt, seasoned salt, table salt, and sea salt. Canned and packaged gravies. Worcestershire sauce. Tartar sauce. Barbecue sauce. Teriyaki sauce. Soy sauce, including reduced sodium. Steak sauce. Fish sauce. Oyster sauce. Cocktail sauce. Horseradish. Ketchup and mustard. Meat flavorings and tenderizers. Bouillon cubes. Hot sauce. Tabasco sauce. Marinades. Taco seasonings. Relishes. Fats and Oils  Butter, stick margarine, lard, shortening, ghee, and bacon fat. Coconut, palm kernel, or palm oils. Regular salad dressings. Other  Pickles and olives. Salted popcorn and pretzels. The items listed above may not be a complete list of foods and beverages to avoid. Contact your dietitian for more information.  Where can I find more information? National Heart, Lung, and Blood Institute: travelstabloid.com This information is not intended to replace advice given to  you by your health care provider. Make sure you discuss any questions you have with your health care provider. Document Released: 04/13/2011 Document Revised: 09/30/2015 Document Reviewed: 02/26/2013 Elsevier Interactive Patient Education  2017 [employer].

## 2016-05-23 LAB — LIPID PANEL
CHOL/HDL RATIO: 5.3 ratio — AB (ref 0.0–5.0)
CHOLESTEROL TOTAL: 195 mg/dL (ref 100–199)
HDL: 37 mg/dL — ABNORMAL LOW (ref >39)
LDL CALC: 116 mg/dL — AB (ref 0–99)
Triglycerides: 209 mg/dL — ABNORMAL HIGH (ref 0–149)
VLDL CHOLESTEROL CAL: 42 mg/dL — AB (ref 5–40)

## 2016-05-23 LAB — CBC WITH DIFFERENTIAL/PLATELET
Basophils Absolute: 0 10*3/uL (ref 0.0–0.2)
Basos: 1 %
EOS (ABSOLUTE): 0.2 10*3/uL (ref 0.0–0.4)
EOS: 4 %
HEMATOCRIT: 44.3 % (ref 37.5–51.0)
HEMOGLOBIN: 14.9 g/dL (ref 13.0–17.7)
IMMATURE GRANS (ABS): 0 10*3/uL (ref 0.0–0.1)
Immature Granulocytes: 0 %
LYMPHS: 33 %
Lymphocytes Absolute: 1.9 10*3/uL (ref 0.7–3.1)
MCH: 30.2 pg (ref 26.6–33.0)
MCHC: 33.6 g/dL (ref 31.5–35.7)
MCV: 90 fL (ref 79–97)
Monocytes Absolute: 0.4 10*3/uL (ref 0.1–0.9)
Monocytes: 8 %
NEUTROS ABS: 3.1 10*3/uL (ref 1.4–7.0)
Neutrophils: 54 %
Platelets: 232 10*3/uL (ref 150–379)
RBC: 4.94 x10E6/uL (ref 4.14–5.80)
RDW: 13.9 % (ref 12.3–15.4)
WBC: 5.7 10*3/uL (ref 3.4–10.8)

## 2016-05-23 LAB — CMP14+EGFR
ALBUMIN: 4.8 g/dL (ref 3.5–5.5)
ALK PHOS: 70 IU/L (ref 39–117)
ALT: 31 IU/L (ref 0–44)
AST: 18 IU/L (ref 0–40)
Albumin/Globulin Ratio: 2.2 (ref 1.2–2.2)
BUN / CREAT RATIO: 15 (ref 9–20)
BUN: 13 mg/dL (ref 6–24)
Bilirubin Total: 0.4 mg/dL (ref 0.0–1.2)
CO2: 23 mmol/L (ref 18–29)
CREATININE: 0.84 mg/dL (ref 0.76–1.27)
Calcium: 9.3 mg/dL (ref 8.7–10.2)
Chloride: 103 mmol/L (ref 96–106)
GFR, EST AFRICAN AMERICAN: 112 mL/min/{1.73_m2} (ref >59)
GFR, EST NON AFRICAN AMERICAN: 97 mL/min/{1.73_m2} (ref >59)
GLOBULIN, TOTAL: 2.2 g/dL (ref 1.5–4.5)
Glucose: 93 mg/dL (ref 65–99)
Potassium: 4.2 mmol/L (ref 3.5–5.2)
SODIUM: 142 mmol/L (ref 134–144)
TOTAL PROTEIN: 7 g/dL (ref 6.0–8.5)

## 2016-05-23 LAB — HEPATITIS C ANTIBODY

## 2016-05-23 LAB — PSA: Prostate Specific Ag, Serum: 0.7 ng/mL (ref 0.0–4.0)

## 2016-05-23 NOTE — Addendum Note (Signed)
Addended by: Shelbie Ammons on: 05/23/2016 03:00 PM   Modules accepted: Orders

## 2016-07-31 ENCOUNTER — Encounter: Payer: Self-pay | Admitting: Internal Medicine

## 2016-07-31 ENCOUNTER — Other Ambulatory Visit: Payer: Self-pay | Admitting: Internal Medicine

## 2016-07-31 ENCOUNTER — Ambulatory Visit (INDEPENDENT_AMBULATORY_CARE_PROVIDER_SITE_OTHER): Payer: BLUE CROSS/BLUE SHIELD | Admitting: Internal Medicine

## 2016-07-31 VITALS — BP 136/90 | HR 85 | Ht 68.0 in | Wt 186.0 lb

## 2016-07-31 DIAGNOSIS — R0600 Dyspnea, unspecified: Secondary | ICD-10-CM

## 2016-07-31 DIAGNOSIS — J479 Bronchiectasis, uncomplicated: Secondary | ICD-10-CM

## 2016-07-31 LAB — PULMONARY FUNCTION TEST
DL/VA % pred: 114 %
DL/VA: 5.24 ml/min/mmHg/L
DLCO COR % PRED: 78 %
DLCO COR: 24.42 ml/min/mmHg
DLCO UNC % PRED: 82 %
DLCO UNC: 25.57 ml/min/mmHg
FEF 25-75 Post: 4.29 L/sec
FEF 25-75 Pre: 4.07 L/sec
FEF2575-%Change-Post: 5 %
FEF2575-%PRED-POST: 145 %
FEF2575-%Pred-Pre: 138 %
FEV1-%CHANGE-POST: 2 %
FEV1-%PRED-POST: 87 %
FEV1-%Pred-Pre: 85 %
FEV1-Post: 3.08 L
FEV1-Pre: 3.01 L
FEV1FVC-%Change-Post: 0 %
FEV1FVC-%PRED-PRE: 112 %
FEV6-%Change-Post: 0 %
FEV6-%PRED-POST: 80 %
FEV6-%Pred-Pre: 79 %
FEV6-Post: 3.55 L
FEV6-Pre: 3.52 L
FEV6FVC-%CHANGE-POST: 0 %
FEV6FVC-%Pred-Post: 104 %
FEV6FVC-%Pred-Pre: 105 %
FVC-%Change-Post: 1 %
FVC-%Pred-Post: 76 %
FVC-%Pred-Pre: 75 %
FVC-PRE: 3.52 L
FVC-Post: 3.57 L
POST FEV1/FVC RATIO: 86 %
PRE FEV6/FVC RATIO: 100 %
Post FEV6/FVC ratio: 100 %
Pre FEV1/FVC ratio: 86 %
RV % pred: 76 %
RV: 1.65 L
TLC % pred: 85 %
TLC: 5.82 L

## 2016-07-31 MED ORDER — LEVOFLOXACIN 750 MG PO TABS: 750.0000 mg | ORAL_TABLET | Freq: Every day | ORAL | 11 refills | Status: DC

## 2016-07-31 NOTE — Progress Notes (Signed)
Subjective:    Patient ID: Tom Richardson, male    DOB: [birth year],     MRN: [id number]     Brief patient profile:  22 yowm never smoker multiple pna as child by HS was fine and then in his 30's head > bad chest cold frequently requiring ov's but many times resolving p several weeks then pna  08/2014 LLL but did not fully recover with lingering sensation of chest tightness then sick again June 2017 abruptly ill with cough green mucus > ER Eval > levaquin x 14 days back to baseline and referred to pulmonary clinic 05/02/2016 by Dr   [name] dx as bronchiectasis by HRCT 04/14/16    History of Present Illness  11/12/2015 1st [facility] office visit/ Davian Wollenberg   Chief Complaint  Patient presents with  . Pulmonary Consult    Referred by [name], PA. Pt states he has had PNA multiple times since age 40, last time dxed on 10/27/15.  He has occ trouble swallowing.    last episode of pna 4/06/10/15 was post basal segment on L and no f/u cxr's prior to acutely worse again in mid June 2017 with fever and green sputum resolved p levaquin but CT chest 10/27/15 still not resolved, assoc with dysphagia and chronic nasal congestion as well rec Please remember to go to the lab and x-ray department downstairs for your tests - we will call you with the results when they are available. schedule Dg Esophagram and sinus CT> neg  GERD diet  If cough with green sputum recurs > repeat the levaquin course you have on hand Please schedule a follow up office visit in 6 weeks, call sooner if needed > did not return        04/04/2016  f/u ov/Zita Ozimek re: ? Recurrent pna Chief Complaint  Patient presents with  . Acute Visit    Pt c/o chills, aches, and congestion for the past 4 days.   started levaquin 500 x 04/02/16 but no better yet, sputum still dark yellow esp in am  but no cp or sob rec Change levaquin to 750 mg daily until gone  Pease see patient coordinator before you leave today  to schedule CT Chest in  10 days (no sooner)  For cough > mucinex dm 1200 mg every 12 hours and if still coughing add tramadol 50 mg one every 4 hours as needed     05/02/2016  f/u ov/Lacrystal Barbe re: bronchiectasis / new dx Chief Complaint  Patient presents with  . Follow-up    discuss CT results, pt feels good   Bronchiectasis =   you have scarring of your bronchial tubes which means that they don't function perfectly normally and mucus tends to pool in certain areas of your lung which can cause pneumonia and further scarring of your lung and bronchial tubes Whenever you develop cough congestion take mucinex or mucinex dm >  If mucus gets really nasty > levaquin 750 mg 5-10 days     07/31/2016  f/u ov/Phila Shoaf re: bronchiectasis s airflow obst  Chief Complaint  Patient presents with  . Follow-up    PFT's done today  had one flare rx with 10 days levaquin since last ov and doing fine now  Not limited by breathing from desired activities    No obvious day to day or daytime variability or assoc excess/ purulent sputum or mucus plugs or hemoptysis or cp or chest tightness, subjective wheeze or overt sinus or hb symptoms.  No unusual exp hx or h/o childhood pna/ asthma or knowledge of premature birth.  Sleeping ok without nocturnal  or early am exacerbation  of respiratory  c/o's or need for noct saba. Also denies any obvious fluctuation of symptoms with weather or environmental changes or other aggravating or alleviating factors except as outlined above   Current Medications, Allergies, Complete Past Medical History, Past Surgical History, Family History, and Social History were reviewed in [employer] record.  ROS  The following are not active complaints unless bolded sore throat, dysphagia, dental problems, itching, sneezing,  nasal congestion or excess/ purulent secretions, ear ache,   fever, chills, sweats, unintended wt loss, classically pleuritic or exertional cp,  orthopnea pnd or leg swelling,  presyncope, palpitations, abdominal pain, anorexia, nausea, vomiting, diarrhea  or change in bowel or bladder habits, change in stools or urine, dysuria,hematuria,  rash, arthralgias, visual complaints, headache, numbness, weakness or ataxia or problems with walking or coordination,  change in mood/affect or memory.                  Objective:   Physical Exam  amb wm nad - all smiles   07/31/2016        186  05/02/2016      196   04/04/2016     192   11/12/15 195 lb (88.451 kg)  11/02/15 196 lb (88.905 kg)  10/27/15 190 lb (86.183 kg)    Vital signs reviewed - Note on arrival 02 sats  97%  on  RA  HEENT: nl dentition, turbinates, and oropharynx. Nl external ear canals without cough reflex   NECK :  without JVD/Nodes/TM/ nl carotid upstrokes bilaterally   LUNGS: no acc muscle use,  Nl contour chest which is clear to A and P bilaterally without cough on insp or exp maneuvers   CV:  RRR  no s3 or murmur or increase in P2, no edema   ABD:  soft and nontender with nl inspiratory excursion in the supine position. No bruits or organomegaly, bowel sounds nl  MS:  Nl gait/ ext warm without deformities, calf tenderness, cyanosis or clubbing No obvious joint restrictions   SKIN: warm and dry without lesions    NEURO:  alert, approp, nl sensorium with  no motor deficits       I personally reviewed images and agree with radiology impression as follows:  CT HRCT Chest   04/14/16  1. Very mild diffuse cylindrical bronchiectasis. This is associated with some very mild peribronchovascular predominant ground-glass attenuation which likely reflects underlying inflammation. 2. No definite imaging findings to suggest interstitial lung diseas           Assessment & Plan:

## 2016-07-31 NOTE — Progress Notes (Signed)
PFT done today. 

## 2016-07-31 NOTE — Patient Instructions (Signed)
No change in medication regimen   Only take 5 days of Levaquin then see if mucus clears then it's done it's job   Please schedule a follow up visit in 12  months but call sooner if needed

## 2016-08-01 NOTE — Assessment & Plan Note (Signed)
See CXR 08/08/2015  See CT chest  10/27/15  Sinus CT 12/01/2015 > 1. Mucosal thickening in the frontal sinus, some ethmoid air cells, and the maxillary sinuses. No air-fluid level. 2. Minimally prominent nasal turbinates and slight nasal septal deviation to the left. UGI 12/01/2015 > Normal barium esophagram.  Immunoglobulins  11/12/2015 > nl / IgE 8 neg RAST - HRCT 04/14/16 1. Very mild diffuse cylindrical bronchiectasis. This is associated with some very mild peribronchovascular predominant ground-glass attenuation which likely reflects underlying inflammation s PF  - alpha one AT screen 05/02/2016   MM nl levels - 05/02/2016  rec prn  levaquin 750 mg 5-10 days   - PFT's  07/31/2016 wnl    I had an extended discussion with the patient reviewing all relevant studies completed to date and  lasting 15 to 20 minutes of a 25 minute visit on the following ongoing concerns:   1) No airflow obst so no need for escalation of care  2) Needs to remember for flares max mucinex dm and just use levaquin x 5 days unless mucus stays discolored then ok to extend to 10   3) Each maintenance medication was reviewed in detail including most importantly the difference between maintenance and as needed and under what circumstances the prns are to be used.  Please see AVS for specific  Instructions which are unique to this visit and I personally typed out  which were reviewed in detail in writing with the patient and a copy provided.

## 2016-08-23 ENCOUNTER — Encounter: Payer: Self-pay | Admitting: *Deleted

## 2016-09-05 DIAGNOSIS — H6123 Impacted cerumen, bilateral: Secondary | ICD-10-CM | POA: Diagnosis not present

## 2016-10-18 DIAGNOSIS — L111 Transient acantholytic dermatosis [Grover]: Secondary | ICD-10-CM | POA: Diagnosis not present

## 2016-10-18 DIAGNOSIS — L821 Other seborrheic keratosis: Secondary | ICD-10-CM | POA: Diagnosis not present

## 2016-11-17 ENCOUNTER — Ambulatory Visit (INDEPENDENT_AMBULATORY_CARE_PROVIDER_SITE_OTHER): Payer: BLUE CROSS/BLUE SHIELD | Admitting: Physician Assistant

## 2016-11-17 ENCOUNTER — Encounter: Payer: Self-pay | Admitting: Physician Assistant

## 2016-11-17 ENCOUNTER — Ambulatory Visit: Payer: BLUE CROSS/BLUE SHIELD | Admitting: Physician Assistant

## 2016-11-17 VITALS — BP 135/85 | HR 67 | Temp 97.7°F | Ht 68.0 in | Wt 188.8 lb

## 2016-11-17 DIAGNOSIS — R7989 Other specified abnormal findings of blood chemistry: Secondary | ICD-10-CM

## 2016-11-17 DIAGNOSIS — J479 Bronchiectasis, uncomplicated: Secondary | ICD-10-CM

## 2016-11-17 DIAGNOSIS — S6992XS Unspecified injury of left wrist, hand and finger(s), sequela: Secondary | ICD-10-CM | POA: Diagnosis not present

## 2016-11-17 DIAGNOSIS — S6990XA Unspecified injury of unspecified wrist, hand and finger(s), initial encounter: Secondary | ICD-10-CM | POA: Insufficient documentation

## 2016-11-17 DIAGNOSIS — M24542 Contracture, left hand: Secondary | ICD-10-CM | POA: Diagnosis not present

## 2016-11-17 NOTE — Addendum Note (Signed)
Addended by: Earlene Plater on: 11/17/2016 02:08 PM   Modules accepted: Orders

## 2016-11-17 NOTE — Patient Instructions (Signed)
In a few days you may receive a survey in the mail or online from [employer] regarding your visit with us today. Please take a moment to fill this out. Your feedback is very important to our whole office. It can help us better understand your needs as well as improve your experience and satisfaction. Thank you for taking your time to complete it. We care about you.  Brendy Ficek, PA-C  

## 2016-11-17 NOTE — Progress Notes (Signed)
BP 135/85   Pulse 67   Temp 97.7 F (36.5 C) (Oral)   Ht 5\' 8"  (1.727 m)   Wt 188 lb 12.8 oz (85.6 kg)   BMI 28.71 kg/m    Subjective:    Patient ID: Tom Richardson, male    DOB: 1955/06/02, 61 y.o.   MRN: [id number]  HPI: Tom Richardson is a 61 y.o. male presenting on 11/17/2016 for Follow-up (6 month)  This patient comes in for periodic recheck on medications and conditions including contracture of hand, old finger injury, bronchiectasis.  He is doing well, having some tenderness in the 4 th finger and contracture in hand getting worse. Followed with K[facility].  Also sees Wert annually for bronchiectasis and is well controlled.  All medications are reviewed today. There are no reports of any problems with the medications. All of the medical conditions are reviewed and updated.  Lab work is reviewed and will be ordered as medically necessary. There are no new problems reported with today's visit.   Relevant past medical, surgical, family and social history reviewed and updated as indicated. Allergies and medications reviewed and updated.  Past Medical History:  Diagnosis Date  . Adenomatous colon polyp 06-27-2010  . Chronic kidney disease   . Kidney stones   . Pneumonia    2 x in the last year   . Sleep apnea    wears cpap     Past Surgical History:  Procedure Laterality Date  . AMPUTATION Left 02/05/2016   Procedure: REVISION OF LEFT RING FINGER AMPUTATION;  Surgeon: Leanora Cover, MD;  Location: [facility];  Service: Orthopedics;  Laterality: Left;  . COLONOSCOPY    . HAND SURGERY    . HERNIA REPAIR    . POLYPECTOMY      Review of Systems  Constitutional: Negative.  Negative for appetite change and fatigue.  HENT: Negative.   Eyes: Negative.  Negative for pain and visual disturbance.  Respiratory: Negative.  Negative for cough, chest tightness, shortness of breath and wheezing.   Cardiovascular: Negative.  Negative for chest pain, palpitations and leg swelling.    Gastrointestinal: Negative.  Negative for abdominal pain, diarrhea, nausea and vomiting.  Endocrine: Negative.   Genitourinary: Negative.   Musculoskeletal: Positive for arthralgias and joint swelling.  Skin: Negative.  Negative for color change and rash.  Neurological: Negative.  Negative for weakness, numbness and headaches.  Psychiatric/Behavioral: Negative.     Allergies as of 11/17/2016      Reactions   Tetracycline    REACTION: rash      Medication List    as of 11/17/2016  8:58 AM   You have not been prescribed any medications.        Objective:    BP 135/85   Pulse 67   Temp 97.7 F (36.5 C) (Oral)   Ht 5\' 8"  (1.727 m)   Wt 188 lb 12.8 oz (85.6 kg)   BMI 28.71 kg/m   Allergies  Allergen Reactions  . Tetracycline     REACTION: rash    Physical Exam  Constitutional: He appears well-developed and well-nourished.  HENT:  Head: Normocephalic and atraumatic.  Eyes: Pupils are equal, round, and reactive to light. Conjunctivae and EOM are normal.  Neck: Normal range of motion. Neck supple.  Cardiovascular: Normal rate, regular rhythm and normal heart sounds.   Pulmonary/Chest: Effort normal and breath sounds normal.  Abdominal: Soft. Bowel sounds are normal.  Musculoskeletal: Normal range of motion.  Skin: Skin  is warm and dry.  Nursing note and vitals reviewed.   Results for orders placed or performed in visit on 07/31/16  Pulmonary function test  Result Value Ref Range   FVC-Pre 3.52 L   FVC-%Pred-Pre 75 %   FVC-Post 3.57 L   FVC-%Pred-Post 76 %   FVC-%Change-Post 1 %   FEV1-Pre 3.01 L   FEV1-%Pred-Pre 85 %   FEV1-Post 3.08 L   FEV1-%Pred-Post 87 %   FEV1-%Change-Post 2 %   FEV6-Pre 3.52 L   FEV6-%Pred-Pre 79 %   FEV6-Post 3.55 L   FEV6-%Pred-Post 80 %   FEV6-%Change-Post 0 %   Pre FEV1/FVC ratio 86 %   FEV1FVC-%Pred-Pre 112 %   Post FEV1/FVC ratio 86 %   FEV1FVC-%Change-Post 0 %   Pre FEV6/FVC Ratio 100 %   FEV6FVC-%Pred-Pre 105 %    Post FEV6/FVC ratio 100 %   FEV6FVC-%Pred-Post 104 %   FEV6FVC-%Change-Post 0 %   FEF 25-75 Pre 4.07 L/sec   FEF2575-%Pred-Pre 138 %   FEF 25-75 Post 4.29 L/sec   FEF2575-%Pred-Post 145 %   FEF2575-%Change-Post 5 %   RV 1.65 L   RV % pred 76 %   TLC 5.82 L   TLC % pred 85 %   DLCO unc 25.57 ml/min/mmHg   DLCO unc % pred 82 %   DLCO cor 24.42 ml/min/mmHg   DLCO cor % pred 78 %   DL/VA 5.24 ml/min/mmHg/L   DL/VA % pred 114 %      Assessment & Plan:   1. Contracture of hand joint, left  2. Injury of finger of left hand, sequela  3. Bronchiectasis without complication [facility]) Keep Appointment with Dr. Melvyn Novas  Continue all other maintenance medications as listed above.  Follow up plan: Return in about 1 year (around 11/17/2017) for recheck.  Educational handout given for [place] PA-C [facility] [address]  [place], [place] [address] [phone]   11/17/2016, 8:58 AM

## 2016-11-18 LAB — LIPID PANEL
CHOL/HDL RATIO: 5 ratio (ref 0.0–5.0)
Cholesterol, Total: 198 mg/dL (ref 100–199)
HDL: 40 mg/dL (ref >39)
LDL Calculated: 120 mg/dL — ABNORMAL HIGH (ref 0–99)
TRIGLYCERIDES: 192 mg/dL — AB (ref 0–149)
VLDL CHOLESTEROL CAL: 38 mg/dL (ref 5–40)

## 2016-11-20 ENCOUNTER — Ambulatory Visit: Payer: BLUE CROSS/BLUE SHIELD | Admitting: Physician Assistant

## 2017-01-26 DIAGNOSIS — S0502XA Injury of conjunctiva and corneal abrasion without foreign body, left eye, initial encounter: Secondary | ICD-10-CM | POA: Diagnosis not present

## 2017-06-28 IMAGING — DX DG ABDOMEN ACUTE W/ 1V CHEST
3 series · 3 of 3 positions shown · non-contrast
Comparison: Chest 11/12/2015

CLINICAL DATA: Severe lower abdominal pain after colonoscopy 2 days
ago.

EXAM:
DG ABDOMEN ACUTE W/ 1V CHEST

[chest pa]
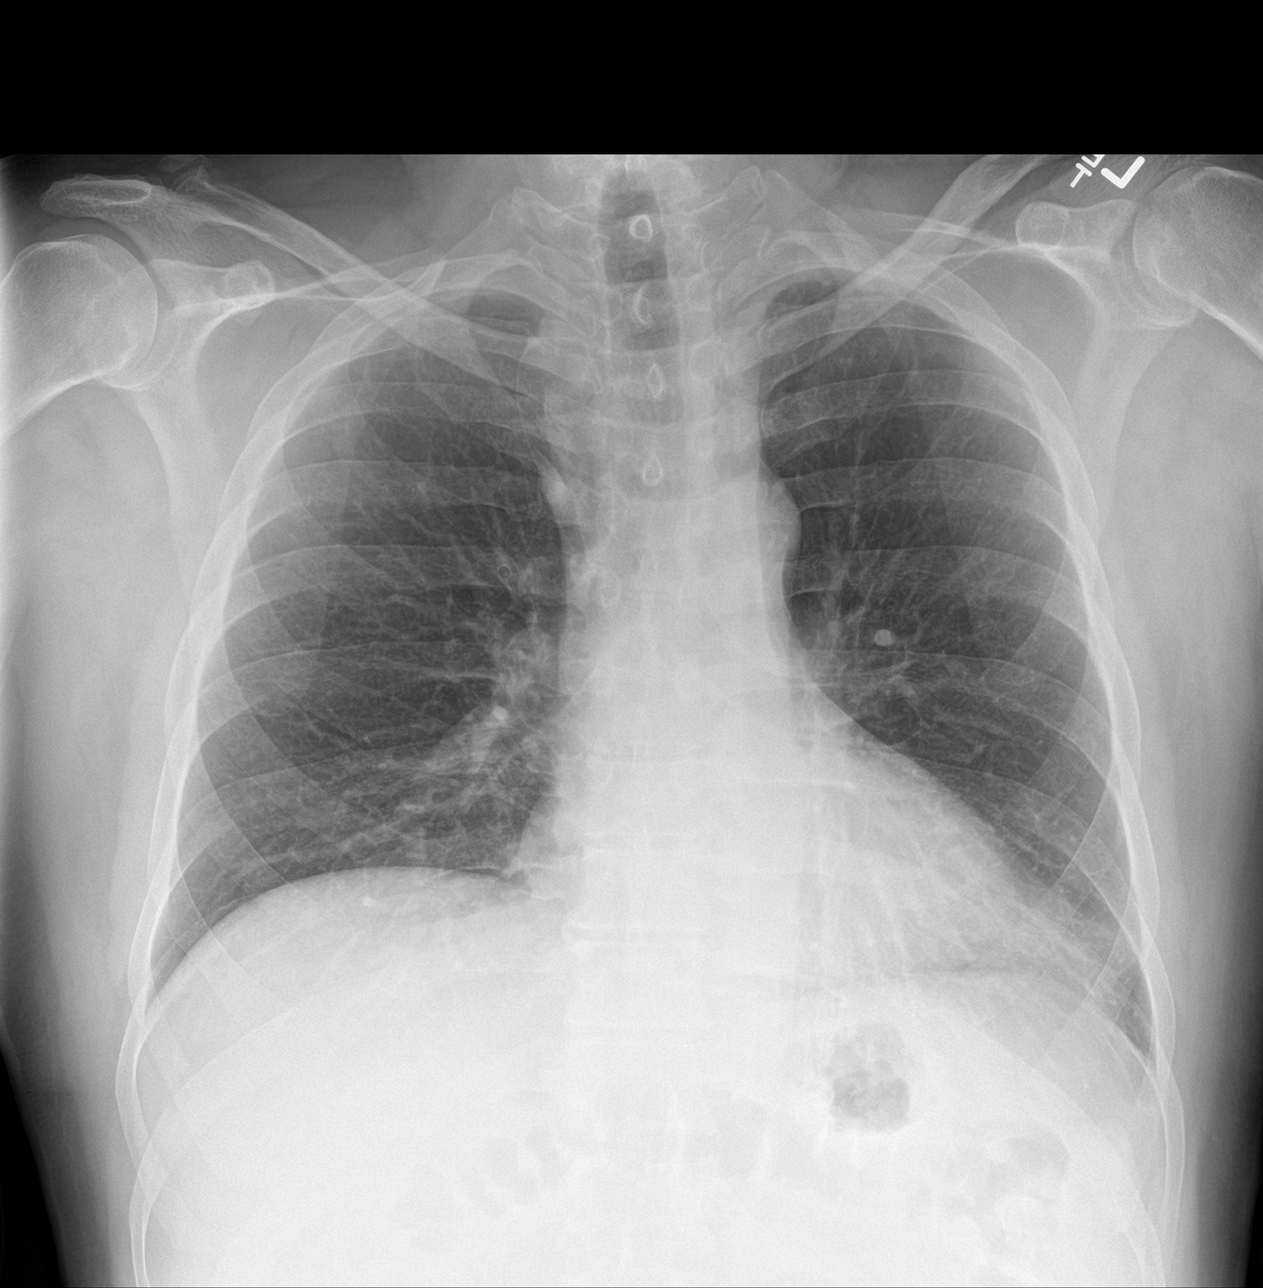

[abdomen erect]
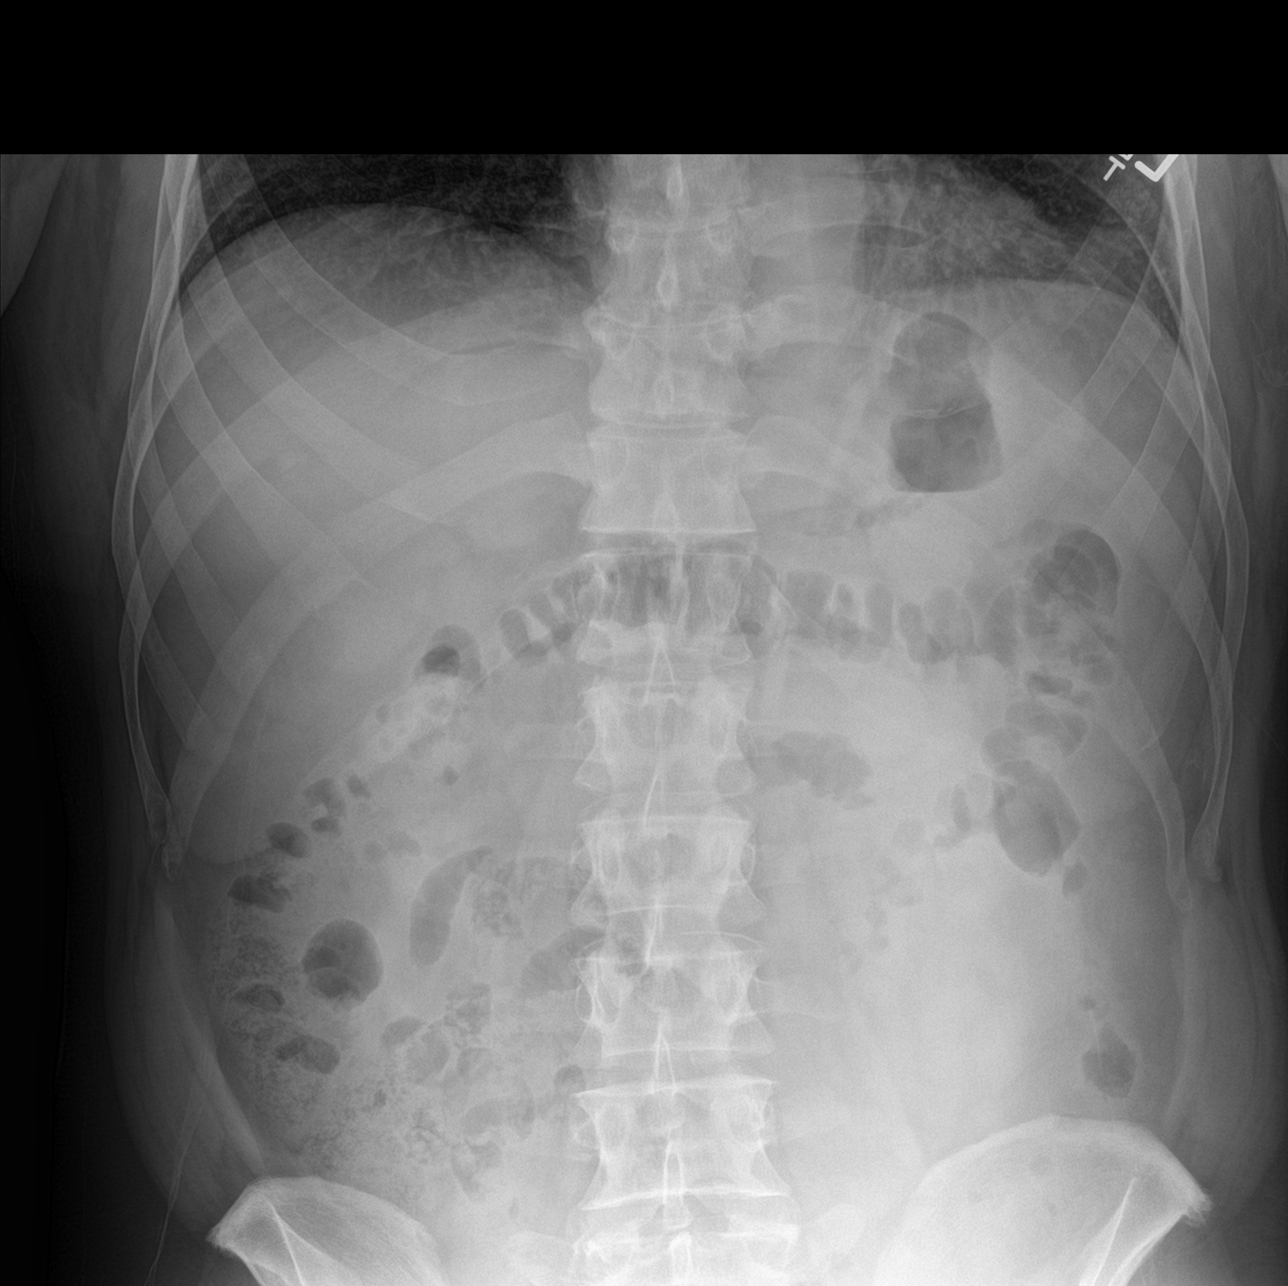

[abdomen supine]
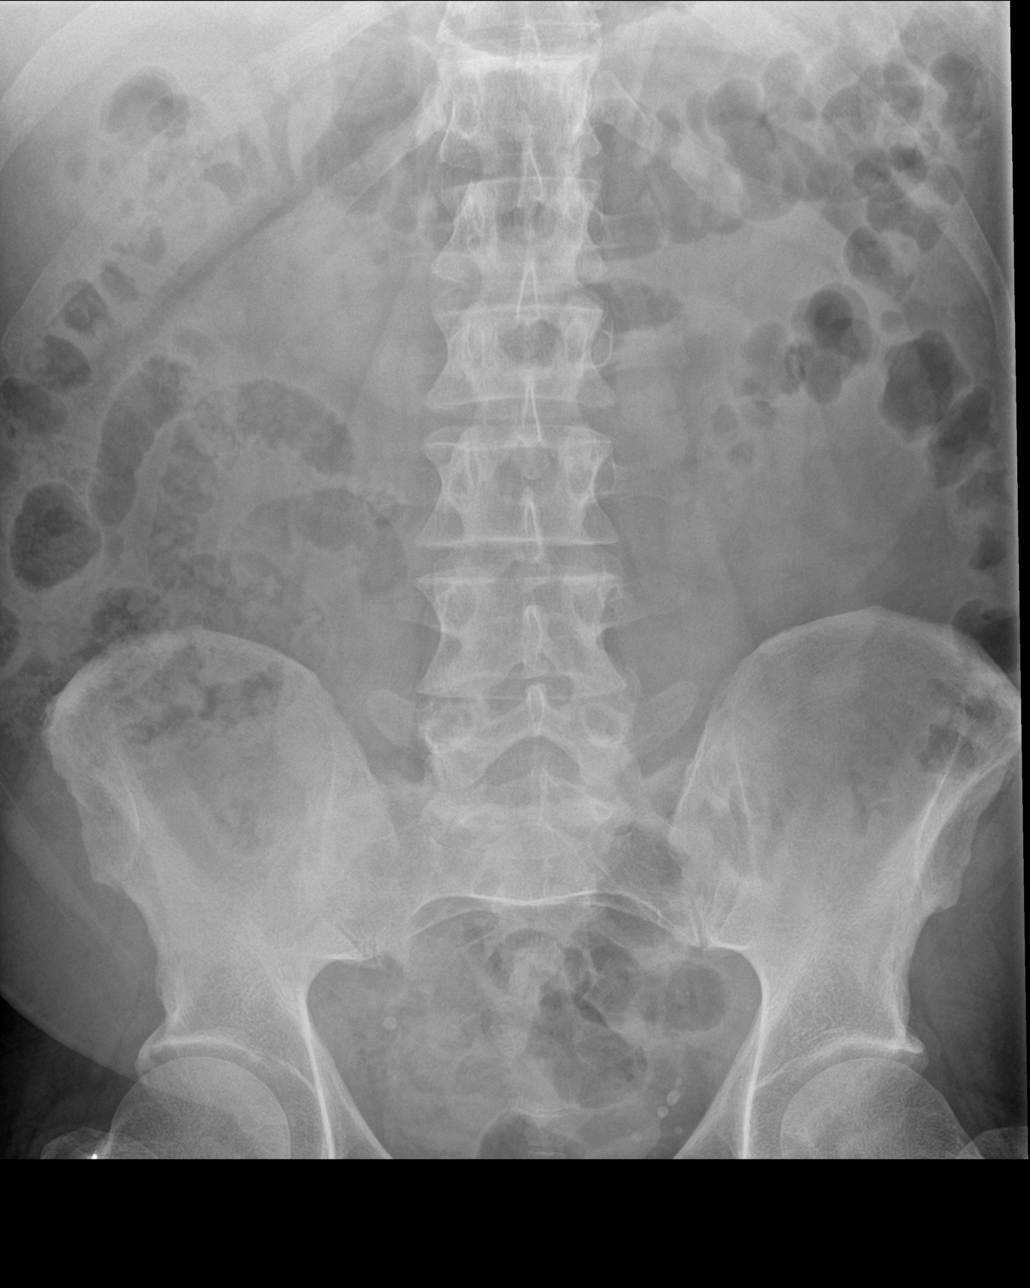

[3 of 3 positions shown; findings below may reference images not displayed]

FINDINGS: Normal heart size and pulmonary vascularity. Persistent localized
infiltration or atelectasis in the left lung base, similar to prior
study. Right lung is clear. Azygos lobe. No pleural effusions. No
pneumothorax.

Scattered gas and stool throughout the colon. No small or large
bowel distention. No free intra-abdominal air. No abnormal air-fluid
levels. Calcified phleboliths in the pelvis. No radiopaque stones.
Visualized bones appear intact.
IMPRESSION: Focal infiltration or atelectasis in the left lung base similar to
the prior study. Normal nonobstructive bowel gas pattern. No free
air.

## 2017-07-31 ENCOUNTER — Ambulatory Visit: Payer: BLUE CROSS/BLUE SHIELD | Admitting: Internal Medicine

## 2017-07-31 ENCOUNTER — Encounter: Payer: Self-pay | Admitting: Internal Medicine

## 2017-07-31 VITALS — BP 152/86 | HR 86 | Ht 68.0 in | Wt 199.0 lb

## 2017-07-31 DIAGNOSIS — K589 Irritable bowel syndrome without diarrhea: Secondary | ICD-10-CM | POA: Diagnosis not present

## 2017-07-31 DIAGNOSIS — J479 Bronchiectasis, uncomplicated: Secondary | ICD-10-CM | POA: Diagnosis not present

## 2017-07-31 MED ORDER — LEVOFLOXACIN 750 MG PO TABS: 750.0000 mg | ORAL_TABLET | Freq: Every day | ORAL | 0 refills | Status: DC

## 2017-07-31 NOTE — Patient Instructions (Addendum)
No change in medication regimen   Only take 5 days of Levaquin then see if mucus clears then it's done it's job    Classic subdiaphragmatic pain pattern suggests ibs:  Stereotypical, migratory with a very limited distribution of pain locations, daytime, not usually exacerbated by exercise  or coughing, worse in sitting position, frequently associated with generalized abd bloating, not as likely to be present supine due to the dome effect of the diaphragm which  is  canceled in that position. Frequently these patients have had multiple negative GI workups and CT scans.  Treatment consists of avoiding foods that cause gas (especially boiled eggs, mexcican food but especially  beans and undercooked vegetables like  spinach and some salads)  and citrucel 1 heaping tsp twice daily with a large glass of water.  Pain should improve w/in 2 weeks and if not then consider further GI work up.      Please schedule a follow up visit in 12 months but call sooner if needed

## 2017-07-31 NOTE — Progress Notes (Signed)
Subjective:    Patient ID: Tom Richardson, male    DOB: May 27, 1955      MRN: [id number]     Brief patient profile:  62  yowm never smoker multiple pna as child by HS was fine and then in his 30's head > bad chest cold frequently requiring ov's but many times resolving p several weeks then pna  08/2014 LLL but did not fully recover with lingering sensation of chest tightness then sick again June 2017 abruptly ill with cough green mucus > ER Eval > levaquin x 14 days back to baseline and referred to pulmonary clinic 05/02/2016 by Dr   [name] dx as bronchiectasis by HRCT 04/14/16    History of Present Illness  11/12/2015 1st [facility] office visit/ Tom Richardson   Chief Complaint  Patient presents with  . Pulmonary Consult    Referred by [name], PA. Pt states he has had PNA multiple times since age 62, last time dxed on 10/27/15.  He has occ trouble swallowing.    last episode of pna 4/06/10/15 was post basal segment on L and no f/u cxr's prior to acutely worse again in mid June 2017 with fever and green sputum resolved p levaquin but CT chest 10/27/15 still not resolved, assoc with dysphagia and chronic nasal congestion as well rec Please remember to go to the lab and x-ray department downstairs for your tests - we will call you with the results when they are available. schedule Dg Esophagram and sinus CT> neg  GERD diet  If cough with green sputum recurs > repeat the levaquin course you have on hand Please schedule a follow up office visit in 6 weeks, call sooner if needed > did not return        04/04/2016  f/u ov/Tom Richardson re: ? Recurrent pna Chief Complaint  Patient presents with  . Acute Visit    Pt c/o chills, aches, and congestion for the past 4 days.   started levaquin 500 x 04/02/16 but no better yet, sputum still dark yellow esp in am  but no cp or sob rec Change levaquin to 750 mg daily until gone  Pease see patient coordinator before you leave today  to schedule CT Chest in  10 days (no sooner)  For cough > mucinex dm 1200 mg every 12 hours and if still coughing add tramadol 50 mg one every 4 hours as needed     05/02/2016  f/u ov/Tom Richardson re: bronchiectasis / new dx Chief Complaint  Patient presents with  . Follow-up    discuss CT results, pt feels good   Bronchiectasis =   you have scarring of your bronchial tubes which means that they don't function perfectly normally and mucus tends to pool in certain areas of your lung which can cause pneumonia and further scarring of your lung and bronchial tubes Whenever you develop cough congestion take mucinex or mucinex dm >  If mucus gets really nasty > levaquin 750 mg 5-10 days     07/31/2016  f/u ov/Tom Richardson re: bronchiectasis s airflow obst  Chief Complaint  Patient presents with  . Follow-up    PFT's done today  had one flare rx with 10 days levaquin since last ov and doing fine now rec No change in medication regimen  Only take 5 days of Levaquin then see if mucus clears then it's done it's job      07/31/2017  f/u ov/Tom Richardson re: bronchiectasis / no recent flare  Chief  Complaint  Patient presents with  . Follow-up    feeling good, no coughing, wheezing, chest tightness. is not wearing CPAP only complaint is some right side swelling no real pain just "doesn't feel right"  Dyspnea:  no Cough: none now Sleep: ok s cpap x one year SABA use:  None Some runny nose in am white mucus   2-3 days a week toward end of day RUQabd discomfort/ never supine x year, likes [place] food/ no assoc nausea  No obvious day to day or daytime variability or assoc excess/ purulent sputum or mucus plugs or hemoptysis or cp or chest tightness, subjective wheeze or overt sinus or hb symptoms. No unusual exposure hx or h/o childhood pna/ asthma or knowledge of premature birth.  Sleeping ok flat without nocturnal  or early am exacerbation  of respiratory  c/o's or need for noct saba. Also denies any obvious fluctuation of symptoms with  weather or environmental changes or other aggravating or alleviating factors except as outlined above   Current Allergies, Complete Past Medical History, Past Surgical History, Family History, and Social History were reviewed in [employer] record.  ROS  The following are not active complaints unless bolded Hoarseness, sore throat, dysphagia, dental problems, itching, sneezing,  nasal congestion or discharge of excess mucus or purulent secretions, ear ache,   fever, chills, sweats, unintended wt loss or wt gain, classically pleuritic or exertional cp,  orthopnea pnd or leg swelling, presyncope, palpitations, abdominal pain, anorexia, nausea, vomiting, diarrhea  or change in bowel habits or change in bladder habits, change in stools or change in urine, dysuria, hematuria,  rash, arthralgias, visual complaints, headache, numbness, weakness or ataxia or problems with walking or coordination,  change in mood/affect or memory.         Meds: no maint or prns listed but has had levaquin refillable in past               Objective:   Physical Exam  amb pleasant wm nad   07/31/2016        186  05/02/2016      196   04/04/2016     192   11/12/15 195 lb (88.451 kg)  11/02/15 196 lb (88.905 kg)  10/27/15 190 lb (86.183 kg)    Vital signs reviewed - Note on arrival 02 sats  96% on RA and bp 152/86 noted  HEENT: nl dentition, turbinates bilaterally, and oropharynx. Nl external ear canals without cough reflex   NECK :  without JVD/Nodes/TM/ nl carotid upstrokes bilaterally   LUNGS: no acc muscle use,  Nl contour chest which is clear to A and P bilaterally without cough on insp or exp maneuvers   CV:  RRR  no s3 or murmur or increase in P2, and no edema   ABD:  soft and nontender with nl inspiratory excursion in the supine position. No bruits or organomegaly appreciated, bowel sounds nl  MS:  Nl gait/ ext warm without deformities, calf tenderness, cyanosis or  clubbing No obvious joint restrictions   SKIN: warm and dry without lesions    NEURO:  alert, approp, nl sensorium with  no motor or cerebellar deficits apparent.                         Assessment & Plan:

## 2017-08-01 ENCOUNTER — Telehealth: Payer: Self-pay | Admitting: Internal Medicine

## 2017-08-01 NOTE — Telephone Encounter (Signed)
Pt is calling back [phone]

## 2017-08-01 NOTE — Telephone Encounter (Signed)
Spoke with pt. States that at his OV yesterday, he was instructed to get Citracel in a powder form. When he went to the pharmacy they didn't have a powder form, only a tablet form. The only powder form they had was Metamucil. Pt is wanting to know if this okay?  MW - please advise. Thanks.

## 2017-08-01 NOTE — Telephone Encounter (Signed)
No - powder is much more effective and most stores have it in white bottler with bright orange top

## 2017-08-01 NOTE — Telephone Encounter (Signed)
lmtcb x1 for pt. 

## 2017-08-01 NOTE — Telephone Encounter (Signed)
Attempted to call pt but no answer.  Left a detailed message on pt's machine stating he needed to do the Citracel in a powder form that most stores have it in a white bottle with a bright orange top.  Stated in the message for pt to call us if he needed anything. Nothing further needed at this time.

## 2017-08-05 ENCOUNTER — Encounter: Payer: Self-pay | Admitting: Internal Medicine

## 2017-08-05 DIAGNOSIS — K589 Irritable bowel syndrome without diarrhea: Secondary | ICD-10-CM | POA: Insufficient documentation

## 2017-08-05 NOTE — Assessment & Plan Note (Signed)
See CXR 08/08/2015  See CT chest  10/27/15  Sinus CT 12/01/2015 > 1. Mucosal thickening in the frontal sinus, some ethmoid air cells, and the maxillary sinuses. No air-fluid level. 2. Minimally prominent nasal turbinates and slight nasal septal deviation to the left. UGI 12/01/2015 > Normal barium esophagram.  Immunoglobulins  11/12/2015 > nl / IgE 8 neg RAST - HRCT 04/14/16 1. Very mild diffuse cylindrical bronchiectasis. This is associated with some very mild peribronchovascular predominant ground-glass attenuation which likely reflects underlying inflammation s PF  - alpha one AT screen 05/02/2016   MM nl levels - 05/02/2016  rec prn  levaquin 750 mg 5-10 days   - PFT's  07/31/2016 wnl    Adequate control on present rx, reviewed in detail with pt > no change in rx needed > levaquin x 5 days and if mucus clear then stop there, if not complete 10 d rx   I had an extended discussion with the patient reviewing all relevant studies completed to date and  lasting 15 to 20 minutes of a 25 minute yearly ov   Each maintenance medication was reviewed in detail including most importantly the difference between maintenance and prns and under what circumstances the prns are to be triggered using an action plan format that is not reflected in the computer generated alphabetically organized AVS.    Please see AVS for specific instructions unique to this visit that I personally wrote and verbalized to the the pt in detail and then reviewed with pt  by my nurse highlighting any  changes in therapy recommended at today's visit to their plan of care.

## 2017-08-05 NOTE — Assessment & Plan Note (Signed)
Reported 07/31/2017 c/w hepatic flex syndrome > trial of diet and citrucel  Classic subdiaphragmatic pain pattern suggests ibs:  Stereotypical, migratory with a very limited distribution of pain locations, daytime, not usually exacerbated by exercise  or coughing, worse in sitting position, frequently associated with generalized abd bloating, not as likely to be present supine due to the dome effect of the diaphragm which  is  canceled in that position. Frequently these patients have had multiple negative GI workups and CT scans.  Treatment consists of avoiding foods that cause gas (especially boiled eggs, mexcican food but especially  beans and undercooked vegetables like  spinach and some salads)  and citrucel 1 heaping tsp twice daily with a large glass of water.  Pain should improve w/in 2 weeks and if not then consider further GI work up.

## 2017-08-22 ENCOUNTER — Encounter: Payer: Self-pay | Admitting: Physician Assistant

## 2017-08-22 ENCOUNTER — Ambulatory Visit: Payer: BLUE CROSS/BLUE SHIELD | Admitting: Physician Assistant

## 2017-08-22 VITALS — BP 134/84 | HR 73 | Temp 98.1°F | Ht 68.0 in | Wt 194.0 lb

## 2017-08-22 DIAGNOSIS — Z0000 Encounter for general adult medical examination without abnormal findings: Secondary | ICD-10-CM

## 2017-08-22 DIAGNOSIS — R140 Abdominal distension (gaseous): Secondary | ICD-10-CM | POA: Insufficient documentation

## 2017-08-22 DIAGNOSIS — G8929 Other chronic pain: Secondary | ICD-10-CM | POA: Insufficient documentation

## 2017-08-22 DIAGNOSIS — R109 Unspecified abdominal pain: Secondary | ICD-10-CM

## 2017-08-22 NOTE — Progress Notes (Signed)
   BP 134/84   Pulse 73   Temp 98.1 F (36.7 C) (Oral)   Ht 5' 8" (1.727 m)   Wt 194 lb (88 kg)   BMI 29.50 kg/m    Subjective:    Patient ID: Tom Richardson, male    DOB: 05/26/1955, 62 y.o.   MRN: [id number]  HPI: Tom Richardson is a 62 y.o. male presenting on 08/22/2017 for Abdominal pain/rib pain  Over the past year the patient has had right lower quadrant pain and right side pain of the abdomen.  He will be very distended and tight at times.  It does not change based on his bowel movements or his diet.  Movement or activity does not change it.  In addition he will have episodes where he has chest wall tightness on the left thoracic area that goes all the way up into the axilla.  He denies any shortness of breath or cardiac sternal pain.  This pain occasionally happens on the left.  But primarily always on the right.  He denies any specific nausea and vomiting associated with fatty meals.  He does have a history of diverticulitis.  His last colonoscopy was 11/16/2015.  It only showed diverticulosis.  Past Medical History:  Diagnosis Date  . Adenomatous colon polyp 06-27-2010  . Chronic kidney disease   . Kidney stones   . Pneumonia    2 x in the last year   . Sleep apnea    wears cpap    Relevant past medical, surgical, family and social history reviewed and updated as indicated. Interim medical history since our last visit reviewed. Allergies and medications reviewed and updated. DATA REVIEWED: CHART IN EPIC  Family History reviewed for pertinent findings.  Review of Systems  Constitutional: Negative.  Negative for appetite change and fatigue.  Eyes: Negative for pain and visual disturbance.  Respiratory: Negative.  Negative for cough, chest tightness, shortness of breath and wheezing.   Cardiovascular: Positive for chest pain. Negative for palpitations and leg swelling.  Gastrointestinal: Positive for abdominal distention, abdominal pain and nausea. Negative for blood in  stool, constipation, diarrhea and vomiting.  Genitourinary: Negative.   Skin: Negative.  Negative for color change and rash.  Neurological: Negative.  Negative for weakness, numbness and headaches.  Psychiatric/Behavioral: Negative.     Allergies as of 08/22/2017      Reactions   Tetracycline    REACTION: rash      Medication List    as of 08/22/2017  2:29 PM   You have not been prescribed any medications.        Objective:    BP 134/84   Pulse 73   Temp 98.1 F (36.7 C) (Oral)   Ht 5' 8" (1.727 m)   Wt 194 lb (88 kg)   BMI 29.50 kg/m   Allergies  Allergen Reactions  . Tetracycline     REACTION: rash    Wt Readings from Last 3 Encounters:  08/22/17 194 lb (88 kg)  07/31/17 199 lb (90.3 kg)  11/17/16 188 lb 12.8 oz (85.6 kg)    Physical Exam  Constitutional: He appears well-developed and well-nourished.  HENT:  Head: Normocephalic and atraumatic.  Eyes: Pupils are equal, round, and reactive to light. Conjunctivae and EOM are normal.  Neck: Normal range of motion. Neck supple.  Cardiovascular: Normal rate, regular rhythm and normal heart sounds.  Pulmonary/Chest: Effort normal and breath sounds normal.  Abdominal: Soft. Bowel sounds are normal.  Musculoskeletal:   Normal range of motion.  Skin: Skin is warm and dry.        Assessment & Plan:   1. Chronic abdominal pain - CBC with Differential/Platelet - CMP14+EGFR - Lipid panel - TSH - PSA - Lipase - Amylase - Microalbumin / creatinine urine ratio  2. Well adult exam - CMP14+EGFR - Lipid panel - TSH - PSA  3. Abdominal distension (gaseous) - CT Abdomen Pelvis W Contrast; Future   Continue all other maintenance medications as listed above.  Follow up plan: Return in about 1 month (around 09/19/2017) for recheck.  Educational handout given for survey/back exercises  Terald Sleeper PA-C [facility] [address]  [place], [place]  [address] [phone]   08/22/2017, 2:29 PM

## 2017-08-22 NOTE — Patient Instructions (Signed)

## 2017-08-23 ENCOUNTER — Ambulatory Visit: Payer: BLUE CROSS/BLUE SHIELD | Admitting: Physician Assistant

## 2017-08-23 LAB — CBC WITH DIFFERENTIAL/PLATELET
BASOS: 1 %
Basophils Absolute: 0.1 10*3/uL (ref 0.0–0.2)
EOS (ABSOLUTE): 0.2 10*3/uL (ref 0.0–0.4)
Eos: 4 %
Hematocrit: 47.5 % (ref 37.5–51.0)
Hemoglobin: 15.9 g/dL (ref 13.0–17.7)
IMMATURE GRANS (ABS): 0 10*3/uL (ref 0.0–0.1)
IMMATURE GRANULOCYTES: 0 %
LYMPHS: 33 %
Lymphocytes Absolute: 1.8 10*3/uL (ref 0.7–3.1)
MCH: 30.5 pg (ref 26.6–33.0)
MCHC: 33.5 g/dL (ref 31.5–35.7)
MCV: 91 fL (ref 79–97)
Monocytes Absolute: 0.5 10*3/uL (ref 0.1–0.9)
Monocytes: 8 %
NEUTROS PCT: 54 %
Neutrophils Absolute: 3 10*3/uL (ref 1.4–7.0)
PLATELETS: 250 10*3/uL (ref 150–379)
RBC: 5.21 x10E6/uL (ref 4.14–5.80)
RDW: 13.8 % (ref 12.3–15.4)
WBC: 5.6 10*3/uL (ref 3.4–10.8)

## 2017-08-23 LAB — MICROALBUMIN / CREATININE URINE RATIO
Creatinine, Urine: 172.1 mg/dL
MICROALB/CREAT RATIO: 8.4 mg/g{creat} (ref 0.0–30.0)
Microalbumin, Urine: 14.4 ug/mL

## 2017-08-23 LAB — PSA: Prostate Specific Ag, Serum: 0.7 ng/mL (ref 0.0–4.0)

## 2017-08-23 LAB — CMP14+EGFR
ALT: 40 IU/L (ref 0–44)
AST: 25 IU/L (ref 0–40)
Albumin/Globulin Ratio: 2 (ref 1.2–2.2)
Albumin: 4.7 g/dL (ref 3.5–5.5)
Alkaline Phosphatase: 77 IU/L (ref 39–117)
BUN/Creatinine Ratio: 16 (ref 9–20)
BUN: 14 mg/dL (ref 6–24)
Bilirubin Total: 0.5 mg/dL (ref 0.0–1.2)
CALCIUM: 9.3 mg/dL (ref 8.7–10.2)
CO2: 24 mmol/L (ref 20–29)
CREATININE: 0.87 mg/dL (ref 0.76–1.27)
Chloride: 104 mmol/L (ref 96–106)
GFR, EST AFRICAN AMERICAN: 109 mL/min/{1.73_m2} (ref >59)
GFR, EST NON AFRICAN AMERICAN: 94 mL/min/{1.73_m2} (ref >59)
GLUCOSE: 81 mg/dL (ref 65–99)
Globulin, Total: 2.3 g/dL (ref 1.5–4.5)
Potassium: 4.7 mmol/L (ref 3.5–5.2)
Sodium: 141 mmol/L (ref 134–144)
TOTAL PROTEIN: 7 g/dL (ref 6.0–8.5)

## 2017-08-23 LAB — LIPID PANEL
CHOL/HDL RATIO: 5.5 ratio — AB (ref 0.0–5.0)
Cholesterol, Total: 203 mg/dL — ABNORMAL HIGH (ref 100–199)
HDL: 37 mg/dL — AB (ref >39)
LDL CALC: 124 mg/dL — AB (ref 0–99)
Triglycerides: 210 mg/dL — ABNORMAL HIGH (ref 0–149)
VLDL CHOLESTEROL CAL: 42 mg/dL — AB (ref 5–40)

## 2017-08-23 LAB — LIPASE: Lipase: 30 U/L (ref 13–78)

## 2017-08-23 LAB — AMYLASE: Amylase: 40 U/L (ref 31–124)

## 2017-08-23 LAB — TSH: TSH: 3.25 u[IU]/mL (ref 0.450–4.500)

## 2017-08-29 ENCOUNTER — Telehealth: Payer: Self-pay

## 2017-08-29 NOTE — Telephone Encounter (Signed)
That sounds appropriate.

## 2017-08-29 NOTE — Telephone Encounter (Signed)
Will defer this to PCP to answer when she returns.

## 2017-08-29 NOTE — Telephone Encounter (Signed)
Patient of Glenard Haring. FYI. Patient states that he is not having the abdominal pain that he was having before. It seems better. He wants to hold off on CT scan at this time due to insurance. He will follow up in 1 month with you. If you still want him to have the CT done please let him know.

## 2017-08-30 NOTE — Telephone Encounter (Signed)
Pt aware of provider feedback. 

## 2017-09-05 DIAGNOSIS — H6123 Impacted cerumen, bilateral: Secondary | ICD-10-CM | POA: Diagnosis not present

## 2017-09-15 IMAGING — DX DG FINGER RING 2+V*L*
3 series · 3 of 3 positions shown · non-contrast
Comparison: None.

CLINICAL DATA: Left fourth digit laceration

EXAM:
LEFT RING FINGER 2+V

[finger ap]
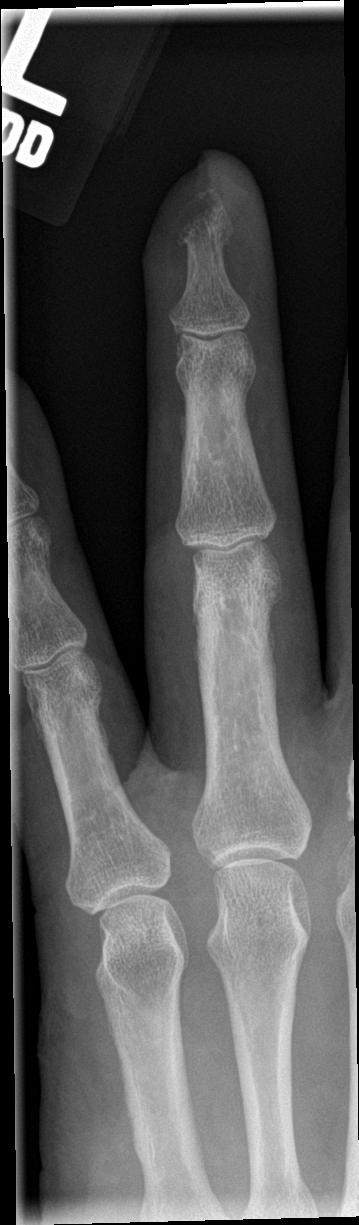

[finger lat]
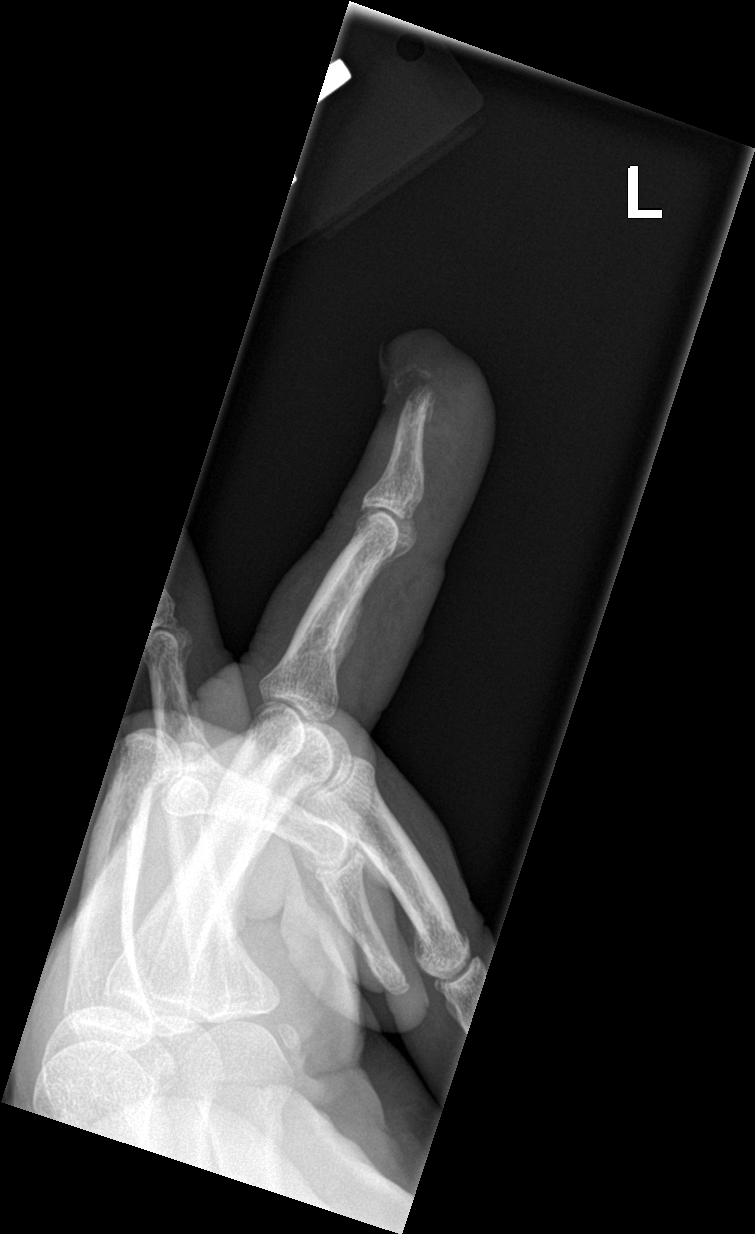

[finger obl]
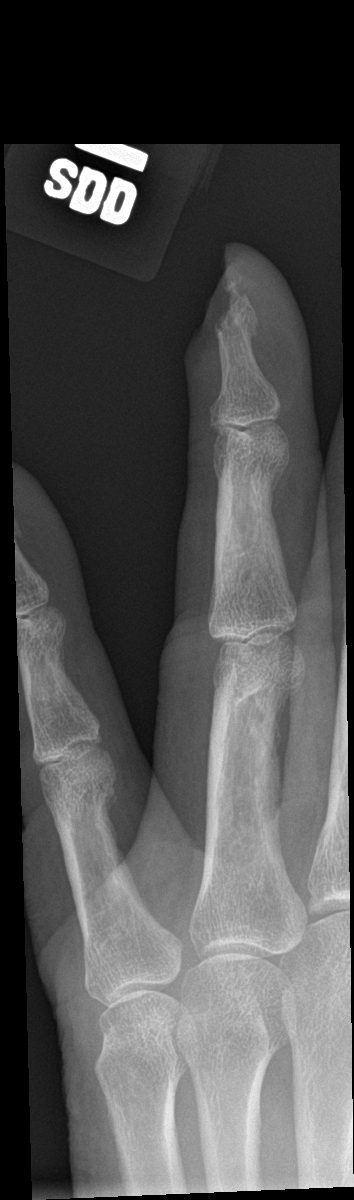

[3 of 3 positions shown; findings below may reference images not displayed]

FINDINGS: Soft tissue defect overlying the tuft of the left fourth finger.
Amputation fracture deformity of the underlying tuft, with small
adjacent avulsion fracture fragments. More proximal portion of the
distal phalanx is intact and normally aligned.
IMPRESSION: Soft tissue defect/laceration overlying the tuft of the left fourth
distal phalanx. Associated amputation fracture deformity of the
underlying tuft.

## 2017-09-27 ENCOUNTER — Ambulatory Visit: Payer: BLUE CROSS/BLUE SHIELD | Admitting: Physician Assistant

## 2018-05-10 DIAGNOSIS — D235 Other benign neoplasm of skin of trunk: Secondary | ICD-10-CM | POA: Diagnosis not present

## 2018-05-10 DIAGNOSIS — D1801 Hemangioma of skin and subcutaneous tissue: Secondary | ICD-10-CM | POA: Diagnosis not present

## 2018-05-10 DIAGNOSIS — D485 Neoplasm of uncertain behavior of skin: Secondary | ICD-10-CM | POA: Diagnosis not present

## 2018-05-10 DIAGNOSIS — L814 Other melanin hyperpigmentation: Secondary | ICD-10-CM | POA: Diagnosis not present

## 2018-05-10 DIAGNOSIS — L821 Other seborrheic keratosis: Secondary | ICD-10-CM | POA: Diagnosis not present

## 2018-05-10 DIAGNOSIS — R21 Rash and other nonspecific skin eruption: Secondary | ICD-10-CM | POA: Diagnosis not present

## 2018-08-02 ENCOUNTER — Ambulatory Visit: Payer: BLUE CROSS/BLUE SHIELD | Admitting: Internal Medicine

## 2018-11-04 ENCOUNTER — Ambulatory Visit: Payer: Self-pay | Admitting: Internal Medicine

## 2018-11-06 ENCOUNTER — Other Ambulatory Visit: Payer: Self-pay

## 2018-11-07 ENCOUNTER — Other Ambulatory Visit: Payer: Self-pay

## 2018-11-08 ENCOUNTER — Encounter: Payer: Self-pay | Admitting: Nurse Practitioner

## 2018-11-08 ENCOUNTER — Ambulatory Visit: Payer: BC Managed Care – PPO | Admitting: Nurse Practitioner

## 2018-11-08 VITALS — BP 137/88 | HR 70 | Temp 97.4°F | Ht 68.0 in | Wt 184.0 lb

## 2018-11-08 DIAGNOSIS — S40261A Insect bite (nonvenomous) of right shoulder, initial encounter: Secondary | ICD-10-CM

## 2018-11-08 DIAGNOSIS — W57XXXA Bitten or stung by nonvenomous insect and other nonvenomous arthropods, initial encounter: Secondary | ICD-10-CM

## 2018-11-08 DIAGNOSIS — L570 Actinic keratosis: Secondary | ICD-10-CM | POA: Diagnosis not present

## 2018-11-08 MED ORDER — CEFUROXIME AXETIL 500 MG PO TABS: 500.0000 mg | ORAL_TABLET | Freq: Two times a day (BID) | ORAL | 0 refills | Status: DC

## 2018-11-08 NOTE — Progress Notes (Signed)
   Subjective:    Patient ID: EUAN WANDLER, male    DOB: March 04, 1956, 63 y.o.   MRN: [id number]   Chief Complaint: Tick Removal   HPI - patient has tick bite on right posterior shoulder. Has a red circle around it and itches. Not sure how long the tick was on there.  - skin lesion on left forearm  Review of Systems  Constitutional: Negative for chills and fever.  HENT: Negative.   Respiratory: Negative.   Cardiovascular: Negative.   Gastrointestinal: Negative.   Musculoskeletal: Negative.   Neurological: Negative.   Psychiatric/Behavioral: Negative.   All other systems reviewed and are negative.      Objective:   Physical Exam Vitals signs and nursing note reviewed.  Constitutional:      Appearance: Normal appearance.  Cardiovascular:     Rate and Rhythm: Normal rate and regular rhythm.     Heart sounds: Normal heart sounds.  Pulmonary:     Effort: Pulmonary effort is normal.     Breath sounds: Normal breath sounds.  Abdominal:     General: Abdomen is flat.     Palpations: Abdomen is soft.  Skin:    General: Skin is warm and dry.     Findings: Lesion (raised scaey flesh colored lesion on left forearm.) present.  Neurological:     General: No focal deficit present.     Mental Status: He is alert and oriented to person, place, and time.  Psychiatric:        Behavior: Behavior normal.    BP 137/88   Pulse 70   Temp (!) 97.4 F (36.3 C) (Oral)   Ht 5\' 8"  (1.727 m)   Wt 184 lb (83.5 kg)   BMI 27.98 kg/m   Cryotherapy of lesion right forearm- patient tolerated well.       Assessment & Plan:  ILIYA SPIVACK in today with chief complaint of Tick Removal   1. Tick bite, initial encounter Refuses  Lab work since cant be done today Discussed alpha-gal - cefUROXime (CEFTIN) 500 MG tablet; Take 1 tablet (500 mg total) by mouth 2 (two) times daily with a meal.  Dispense: 28 tablet; Refill: 0  2. Keratosis Cryotherapy performed Clean with antibacterial soap  daily RTO prn  Mary-Margaret Hassell Done, FNP

## 2018-11-08 NOTE — Patient Instructions (Signed)

## 2018-11-11 ENCOUNTER — Other Ambulatory Visit: Payer: Self-pay | Admitting: Nurse Practitioner

## 2018-11-11 ENCOUNTER — Other Ambulatory Visit: Payer: Self-pay

## 2018-11-11 ENCOUNTER — Other Ambulatory Visit: Payer: BC Managed Care – PPO

## 2018-11-11 DIAGNOSIS — S20469A Insect bite (nonvenomous) of unspecified back wall of thorax, initial encounter: Secondary | ICD-10-CM | POA: Diagnosis not present

## 2018-11-11 DIAGNOSIS — W57XXXA Bitten or stung by nonvenomous insect and other nonvenomous arthropods, initial encounter: Secondary | ICD-10-CM | POA: Diagnosis not present

## 2018-11-11 NOTE — Addendum Note (Signed)
Addended by: Chevis Pretty on: 11/11/2018 09:50 AM   Modules accepted: Orders

## 2018-11-13 LAB — LYME AB/WESTERN BLOT REFLEX
LYME DISEASE AB, QUANT, IGM: <0.8 index (ref 0.00–0.79)
Lyme IgG/IgM Ab: <0.91 {ISR} (ref 0.00–0.90)

## 2018-11-13 LAB — ROCKY MTN SPOTTED FVR ABS PNL(IGG+IGM)
RMSF IgG: NEGATIVE
RMSF IgM: 0.6 index (ref 0.00–0.89)

## 2018-11-18 ENCOUNTER — Telehealth: Payer: Self-pay | Admitting: *Deleted

## 2018-11-18 ENCOUNTER — Telehealth: Payer: Self-pay | Admitting: Physician Assistant

## 2018-11-18 NOTE — Telephone Encounter (Signed)
Patient aware of negative RMSF and Lyme lab results and would like to know if he needs to complete antibiotic or can he discontinue

## 2018-11-18 NOTE — Telephone Encounter (Signed)
Left detailed message stating lab results are negative per result note

## 2018-11-18 NOTE — Telephone Encounter (Signed)
Ok to discontinue if the redness from the tick bite has resolved.

## 2018-11-18 NOTE — Telephone Encounter (Signed)
Patient aware.

## 2018-11-21 ENCOUNTER — Telehealth: Payer: Self-pay | Admitting: Physician Assistant

## 2018-11-21 ENCOUNTER — Other Ambulatory Visit: Payer: Self-pay | Admitting: Physician Assistant

## 2018-11-21 MED ORDER — AMOXICILLIN 875 MG PO TABS: 875.0000 mg | ORAL_TABLET | Freq: Two times a day (BID) | ORAL | 0 refills | Status: DC

## 2018-11-21 NOTE — Telephone Encounter (Signed)
Sent Augmentin, this is a strong enough antibiotic and has amoxicillin in it which can be used for tach.

## 2018-11-21 NOTE — Progress Notes (Signed)
doxycy

## 2018-11-21 NOTE — Telephone Encounter (Signed)
Patient aware.

## 2018-11-21 NOTE — Telephone Encounter (Signed)
Please review and advise.

## 2019-02-10 ENCOUNTER — Ambulatory Visit: Payer: Self-pay | Admitting: Internal Medicine

## 2019-04-08 ENCOUNTER — Ambulatory Visit: Payer: Self-pay | Admitting: Internal Medicine

## 2019-04-10 ENCOUNTER — Ambulatory Visit: Payer: Self-pay | Admitting: Internal Medicine

## 2019-05-30 ENCOUNTER — Ambulatory Visit (INDEPENDENT_AMBULATORY_CARE_PROVIDER_SITE_OTHER): Payer: BC Managed Care – PPO

## 2019-05-30 ENCOUNTER — Ambulatory Visit: Payer: BC Managed Care – PPO | Admitting: Internal Medicine

## 2019-05-30 ENCOUNTER — Other Ambulatory Visit: Payer: Self-pay

## 2019-05-30 ENCOUNTER — Encounter: Payer: Self-pay | Admitting: Internal Medicine

## 2019-05-30 DIAGNOSIS — J479 Bronchiectasis, uncomplicated: Secondary | ICD-10-CM | POA: Diagnosis not present

## 2019-05-30 MED ORDER — LEVOFLOXACIN 750 MG PO TABS: 750.0000 mg | ORAL_TABLET | Freq: Every day | ORAL | 0 refills | Status: DC

## 2019-05-30 NOTE — Progress Notes (Signed)
Subjective:    Patient ID: Tom Richardson, male    DOB: [birth year]      MRN: [id number]     Brief patient profile:  64  yowm MM/never smoker multiple pna as child by HS was fine and then in his 30's head > bad chest cold frequently requiring ov's but many times resolving p several weeks then pna  08/2014 LLL but did not fully recover with lingering sensation of chest tightness then sick again June 2017 abruptly ill with cough green mucus > ER Eval > levaquin x 14 days back to baseline and referred to pulmonary clinic 05/02/2016 by Dr   [name] dx as bronchiectasis by HRCT 04/14/16    History of Present Illness  11/12/2015 1st [facility] office visit/ [name]   Chief Complaint  Patient presents with  . Pulmonary Consult    Referred by [name], PA. Pt states he has had PNA multiple times since age 64, last time dxed on 10/27/15.  He has occ trouble swallowing.    last episode of pna 4/06/10/15 was post basal segment on L and no f/u cxr's prior to acutely worse again in mid June 2017 with fever and green sputum resolved p levaquin but CT chest 10/27/15 still not resolved, assoc with dysphagia and chronic nasal congestion as well rec Please remember to go to the lab and x-ray department downstairs for your tests - we will call you with the results when they are available. schedule Dg Esophagram and sinus CT> neg  GERD diet  If cough with green sputum recurs > repeat the levaquin course you have on hand Please schedule a follow up office visit in 6 weeks, call sooner if needed > did not return        04/04/2016  f/u ov/[name] re: ? Recurrent pna Chief Complaint  Patient presents with  . Acute Visit    Pt c/o chills, aches, and congestion for the past 4 days.   started levaquin 500 x 04/02/16 but no better yet, sputum still dark yellow esp in am  but no cp or sob rec Change levaquin to 750 mg daily until gone  Pease see patient coordinator before you leave today  to schedule CT Chest  in 10 days (no sooner)  For cough > mucinex dm 1200 mg every 12 hours and if still coughing add tramadol 50 mg one every 4 hours as needed     05/02/2016  f/u ov/[name] re: bronchiectasis / new dx Chief Complaint  Patient presents with  . Follow-up    discuss CT results, pt feels good   Bronchiectasis =   you have scarring of your bronchial tubes which means that they don't function perfectly normally and mucus tends to pool in certain areas of your lung which can cause pneumonia and further scarring of your lung and bronchial tubes Whenever you develop cough congestion take mucinex or mucinex dm >  If mucus gets really nasty > levaquin 750 mg 5-10 days     07/31/2016  f/u ov/[name] re: bronchiectasis s airflow obst  Chief Complaint  Patient presents with  . Follow-up    PFT's done today  had one flare rx with 10 days levaquin since last ov and doing fine now rec No change in medication regimen  Only take 5 days of Levaquin then see if mucus clears then it's done it's job      07/31/2017  f/u ov/[name] re: bronchiectasis / no recent flare  Chief  Complaint  Patient presents with  . Follow-up    feeling good, no coughing, wheezing, chest tightness. is not wearing CPAP only complaint is some right side swelling no real pain just "doesn't feel right"  Dyspnea:  no Cough: none now Sleep: ok s cpap x one year SABA use:  None Some runny nose in am white mucus  2-3 days a week toward end of day RUQabd discomfort/ never supine x year, likes [place] food/ no assoc nausea rec  No change in medication regimen  Only take 5 days of Levaquin then see if mucus clears then it's done it's job  Classic subdiaphragmatic pain pattern suggests ibs   05/30/2019  f/u ov/[name] re: bronchiectasis / chonic nasal congestion  Chief Complaint  Patient presents with  . Follow-up    Breathing is "fine" and has minimal cough.    Dyspnea:   Not limited by breathing from desired activities   Cough: minimal  rattle/ mostly clear attibutes to pnds  Sleeping: flat fine  SABA use: none  02: none    No obvious day to day or daytime variability or assoc excess/ purulent sputum or mucus plugs or hemoptysis or cp or chest tightness, subjective wheeze or overt   hb symptoms.   Sleeping as above without nocturnal  or early am exacerbation  of respiratory  c/o's or need for noct saba. Also denies any obvious fluctuation of symptoms with weather or environmental changes or other aggravating or alleviating factors except as outlined above   No unusual exposure hx or h/o childhood pna/ asthma or knowledge of premature birth.  Current Allergies, Complete Past Medical History, Past Surgical History, Family History, and Social History were reviewed in [employer] record.  ROS  The following are not active complaints unless bolded Hoarseness, sore throat, dysphagia, dental problems, itching, sneezing,  nasal congestion or discharge of excess mucus or purulent secretions, ear ache,   fever, chills, sweats, unintended wt loss or wt gain, classically pleuritic or exertional cp,  orthopnea pnd or arm/hand swelling  or leg swelling, presyncope, palpitations, abdominal pain, anorexia, nausea, vomiting, diarrhea  or change in bowel habits or change in bladder habits, change in stools or change in urine, dysuria, hematuria,  rash, arthralgias, visual complaints, headache, numbness, weakness or ataxia or problems with walking or coordination,  change in mood or  memory.               Objective:   Physical Exam  amb pleasant wm   05/30/2019        187  07/31/2016        186  05/02/2016      196   04/04/2016     192   11/12/15 195 lb (88.451 kg)  11/02/15 196 lb (88.905 kg)  10/27/15 190 lb (86.183 kg)        HEENT : pt wearing mask not removed for exam due to covid -19 concerns.    NECK :  without JVD/Nodes/TM/ nl carotid upstrokes bilaterally   LUNGS: no acc muscle use,  Nl contour  chest which is clear to A and P bilaterally without cough on insp or exp maneuvers   CV:  RRR  no s3 or murmur or increase in P2, and no edema   ABD:  soft and nontender with nl inspiratory excursion in the supine position. No bruits or organomegaly appreciated, bowel sounds nl  MS:  Nl gait/ ext warm without deformities, calf tenderness, cyanosis or clubbing No  obvious joint restrictions   SKIN: warm and dry without lesions    NEURO:  alert, approp, nl sensorium with  no motor or cerebellar deficits apparent.        CXR PA and Lateral:   05/30/2019 :    I personally reviewed images and agree with radiology impression as follows:    No active cardiopulmonary disease.              Assessment & Plan:

## 2019-05-30 NOTE — Patient Instructions (Addendum)
For nasty mucus take levaquin 750 mg daily x 5 days  - stop if ankle or other tendon pain   Please remember to go to the  x-ray department  for your tests - we will call you with the results when they are available    I recommend  Prevar 13 now and then repeat pneumococcal around the age of 37   Covid 55 vaccine as soon as you can and continue to observe the guidelines    Please schedule a follow up visit in 12 months but call sooner if needed    .

## 2019-05-31 ENCOUNTER — Encounter: Payer: Self-pay | Admitting: Internal Medicine

## 2019-05-31 NOTE — Assessment & Plan Note (Addendum)
Pneumonia as child  See CXR 08/08/2015  See CT chest  10/27/15  Sinus CT 12/01/2015 > 1. Mucosal thickening in the frontal sinus, some ethmoid air cells, and the maxillary sinuses. No air-fluid level. 2. Minimally prominent nasal turbinates and slight nasal septal deviation to the left. UGI 12/01/2015 > Normal barium esophagram.  Immunoglobulins  11/12/2015 > nl / IgE 8 neg RAST - HRCT 04/14/16 1. Very mild diffuse cylindrical bronchiectasis. This is associated with some very mild peribronchovascular predominant ground-glass attenuation which likely reflects underlying inflammation s PF  - alpha one AT screen 05/02/2016   MM nl levels - 05/02/2016  rec prn  levaquin 750 mg 5 days   - PFT's  07/31/2016 wnl     Adequate control on present rx, reviewed in detail with pt > no change in rx needed  = levaquin 750 x 5 days prn/ advised re tendonitis concerns          Each maintenance medication was reviewed in detail including emphasizing most importantly the difference between maintenance and prns and under what circumstances the prns are to be triggered using an action plan format where appropriate.  Also rec prevnar 35 now and pneumovax at age 25 both per PCP tracking the rest of his healthcare maint as well   Total time for H and P, chart review, counseling,  and generating customized AVS unique to this office visit / charting = 20 min

## 2019-12-01 ENCOUNTER — Telehealth: Payer: Self-pay | Admitting: Internal Medicine

## 2019-12-01 NOTE — Telephone Encounter (Signed)
pt has lost RX for levofloxacin (LEVAQUIN) 750 MG tablet [[id number]] - pleaser advise - [place] [facility]    Lasts OV 05/30/2019. Patient contacted and understands we are waiting to hear back from Dr. Melvyn Novas.

## 2019-12-01 NOTE — Telephone Encounter (Signed)
levaquin 750 mg qd x 5 day   #11 refills

## 2019-12-02 MED ORDER — LEVOFLOXACIN 750 MG PO TABS: 750.0000 mg | ORAL_TABLET | Freq: Every day | ORAL | 11 refills | Status: AC

## 2019-12-02 NOTE — Telephone Encounter (Signed)
rx was sent and left pt detailed msg letting him know ok per dpr

## 2020-01-19 DIAGNOSIS — K5790 Diverticulosis of intestine, part unspecified, without perforation or abscess without bleeding: Secondary | ICD-10-CM | POA: Insufficient documentation

## 2020-02-16 DIAGNOSIS — E782 Mixed hyperlipidemia: Secondary | ICD-10-CM | POA: Insufficient documentation

## 2020-06-01 ENCOUNTER — Ambulatory Visit: Payer: BC Managed Care – PPO | Admitting: Internal Medicine

## 2020-07-06 ENCOUNTER — Ambulatory Visit: Payer: BC Managed Care – PPO | Admitting: Internal Medicine

## 2020-07-19 ENCOUNTER — Encounter: Payer: Self-pay | Admitting: Internal Medicine

## 2020-07-19 ENCOUNTER — Other Ambulatory Visit: Payer: Self-pay

## 2020-07-19 ENCOUNTER — Ambulatory Visit: Payer: 59 | Admitting: Internal Medicine

## 2020-07-19 DIAGNOSIS — G4733 Obstructive sleep apnea (adult) (pediatric): Secondary | ICD-10-CM

## 2020-07-19 DIAGNOSIS — J479 Bronchiectasis, uncomplicated: Secondary | ICD-10-CM

## 2020-07-19 DIAGNOSIS — I10 Essential (primary) hypertension: Secondary | ICD-10-CM | POA: Diagnosis not present

## 2020-07-19 MED ORDER — LEVOFLOXACIN 750 MG PO TABS: 750.0000 mg | ORAL_TABLET | Freq: Every day | ORAL | 11 refills | Status: DC

## 2020-07-19 NOTE — Patient Instructions (Addendum)
We will be referring sleep medicine   Be aware than any drug than ends in PRIL is an ace inhibitor which can cause and interfere with the kidney handles states of low blood flow or dehydration  And needs to be stopped   Pulmonary is as needed

## 2020-07-19 NOTE — Progress Notes (Signed)
Subjective:    Patient ID: Tom Richardson, male    DOB: 01/01/1956      MRN: [id number]     Brief patient profile:  71  yowm MM/never smoker multiple pna as child by HS was fine and then in his 30's head > bad chest cold frequently requiring ov's but many times resolving p several weeks then pna  08/2014 LLL but did not fully recover with lingering sensation of chest tightness then sick again June 2017 abruptly ill with cough green mucus > ER Eval > levaquin x 14 days back to baseline and referred to pulmonary clinic 05/02/2016 by Dr   [name] dx as bronchiectasis by HRCT 04/14/16    History of Present Illness  11/12/2015 1st [facility] office visit/ Tom Richardson   Chief Complaint  Patient presents with   Pulmonary Consult    Referred by [name], PA. Pt states he has had PNA multiple times since age 57, last time dxed on 10/27/15.  He has occ trouble swallowing.    last episode of pna 4/06/10/15 was post basal segment on L and no f/u cxr's prior to acutely worse again in mid June 2017 with fever and green sputum resolved p levaquin but CT chest 10/27/15 still not resolved, assoc with dysphagia and chronic nasal congestion as well rec Please remember to go to the lab and x-ray department downstairs for your tests - we will call you with the results when they are available. schedule Dg Esophagram and sinus CT> neg  GERD diet  If cough with green sputum recurs > repeat the levaquin course you have on hand Please schedule a follow up office visit in 6 weeks, call sooner if needed > did not return        04/04/2016  f/u ov/Tom Richardson re: ? Recurrent pna Chief Complaint  Patient presents with   Acute Visit    Pt c/o chills, aches, and congestion for the past 4 days.   started levaquin 500 x 04/02/16 but no better yet, sputum still dark yellow esp in am  but no cp or sob rec Change levaquin to 750 mg daily until gone  Pease see patient coordinator before you leave today  to schedule CT Chest  in 10 days (no sooner)  For cough > mucinex dm 1200 mg every 12 hours and if still coughing add tramadol 50 mg one every 4 hours as needed     05/02/2016  f/u ov/Tom Richardson re: bronchiectasis / new dx Chief Complaint  Patient presents with   Follow-up    discuss CT results, pt feels good   Bronchiectasis =   you have scarring of your bronchial tubes which means that they don't function perfectly normally and mucus tends to pool in certain areas of your lung which can cause pneumonia and further scarring of your lung and bronchial tubes Whenever you develop cough congestion take mucinex or mucinex dm >  If mucus gets really nasty > levaquin 750 mg 5-10 days     07/31/2016  f/u ov/Tom Richardson re: bronchiectasis s airflow obst  Chief Complaint  Patient presents with   Follow-up    PFT's done today  had one flare rx with 10 days levaquin since last ov and doing fine now rec No change in medication regimen  Only take 5 days of Levaquin then see if mucus clears then it's done it's job      07/31/2017  f/u ov/Tom Richardson re: bronchiectasis / no recent flare  Chief  Complaint  Patient presents with   Follow-up    feeling good, no coughing, wheezing, chest tightness. is not wearing CPAP only complaint is some right side swelling no real pain just "doesn't feel right"  Dyspnea:  no Cough: none now Sleep: ok s cpap x one year SABA use:  None Some runny nose in am white mucus  2-3 days a week toward end of day RUQabd discomfort/ never supine x year, likes [place] food/ no assoc nausea rec  No change in medication regimen  Only take 5 days of Levaquin then see if mucus clears then it's done it's job  Classic subdiaphragmatic pain pattern suggests ibs   05/30/2019  f/u ov/Tom Richardson re: bronchiectasis / chonic nasal congestion  Chief Complaint  Patient presents with   Follow-up    Breathing is "fine" and has minimal cough.    Dyspnea:   Not limited by breathing from desired activities   Cough: minimal  rattle/ mostly clear attibutes to pnds  Sleeping: flat fine  SABA use: none  02: none  rec For nasty mucus take levaquin 750 mg daily x 5 days  - stop if ankle or other tendon pain  I recommend  Prevar 13 now and then repeat pneumococcal around the age of 35  Covid 69 vaccine as soon as you can and continue to observe the guidelines    07/19/2020  f/u ov/Tom Richardson re: bronchiectasis s airflow obst on prn levaquin 750 x 5 d On lisinopril  Chief Complaint  Patient presents with   Follow-up    No issues  Dyspnea:  Not limited by breathing from desired activities  Walks dog, some hills Cough: none  Sleeping:better on cpap (old equipment)  SABA use: none 02: none  Covid status:  vax x 3    No obvious day to day or daytime variability or assoc excess/ purulent sputum or mucus plugs or hemoptysis or cp or chest tightness, subjective wheeze or overt sinus or hb symptoms.   Sleeping as abovewithout nocturnal  or early am exacerbation  of respiratory  c/o's or need for noct saba. Also denies any obvious fluctuation of symptoms with weather or environmental changes or other aggravating or alleviating factors except as outlined above   No unusual exposure hx or h/o childhood pna/ asthma or knowledge of premature birth.  Current Allergies, Complete Past Medical History, Past Surgical History, Family History, and Social History were reviewed in [employer] record.  ROS  The following are not active complaints unless bolded Hoarseness, sore throat, dysphagia, dental problems, itching, sneezing,  nasal congestion or discharge of excess mucus or purulent secretions, ear ache,   fever, chills, sweats, unintended wt loss or wt gain, classically pleuritic or exertional cp,  orthopnea pnd or arm/hand swelling  or leg swelling, presyncope, palpitations, abdominal pain, anorexia, nausea, vomiting, diarrhea  or change in bowel habits or change in bladder habits, change in stools or change  in urine, dysuria, hematuria,  rash, arthralgias, visual complaints, headache, numbness, weakness or ataxia or problems with walking or coordination,  change in mood or  memory.        Current Meds  Medication Sig   levofloxacin (LEVAQUIN) 750 MG tablet Take 1 tablet (750 mg total) by mouth daily. One daily stop if develop aching in joints/ muscles   lisinopril (ZESTRIL) 20 MG tablet Take by mouth.  Objective:   Physical Exam   07/19/2020       189 05/30/2019        187  07/31/2016        186  05/02/2016      196   04/04/2016     192   11/12/15 195 lb (88.451 kg)  11/02/15 196 lb (88.905 kg)  10/27/15 190 lb (86.183 kg)    Vital signs reviewed  07/19/2020  - Note at rest 02 sats  07% on RA   General appearance:   Pleasant amb wm nad    HEENT : pt wearing mask not removed for exam due to covid -19 concerns.    NECK :  without JVD/Nodes/TM/ nl carotid upstrokes bilaterally   LUNGS: no acc muscle use,  Nl contour chest which is clear to A and P bilaterally without cough on insp or exp maneuvers   CV:  RRR  no s3 or murmur or increase in P2, and no edema   ABD:  soft and nontender with nl inspiratory excursion in the supine position. No bruits or organomegaly appreciated, bowel sounds nl  MS:  Nl gait/ ext warm without deformities, calf tenderness, cyanosis or clubbing No obvious joint restrictions   SKIN: warm and dry without lesions    NEURO:  alert, approp, nl sensorium with  no motor or cerebellar deficits apparent.                    Assessment & Plan:

## 2020-07-20 ENCOUNTER — Encounter: Payer: Self-pay | Admitting: Internal Medicine

## 2020-07-20 DIAGNOSIS — I10 Essential (primary) hypertension: Secondary | ICD-10-CM | POA: Insufficient documentation

## 2020-07-20 NOTE — Assessment & Plan Note (Addendum)
Pneumonia as child  See CXR 08/08/2015  See CT chest  10/27/15  Sinus CT 12/01/2015 > 1. Mucosal thickening in the frontal sinus, some ethmoid air cells, and the maxillary sinuses. No air-fluid level. 2. Minimally prominent nasal turbinates and slight nasal septal deviation to the left. UGI 12/01/2015 > Normal barium esophagram.  Immunoglobulins  11/12/2015 > nl / IgE 8 neg RAST - HRCT 04/14/16 1. Very mild diffuse cylindrical bronchiectasis. This is associated with some very mild peribronchovascular predominant ground-glass attenuation which likely reflects underlying inflammation s PF  - alpha one AT screen 05/02/2016   MM nl levels - 05/02/2016  rec prn  levaquin 750 mg 5 days   - PFT's  07/31/2016 wnl   No recent flares, no airflow limitation > f/u yearly same rx   rec  Pneumonia vax updates per PCP

## 2020-07-20 NOTE — Assessment & Plan Note (Addendum)
Referred to sleep medicine at [facility] 07/19/2020   Fine with me also to let sleep medicine refill prn levaquin

## 2020-07-20 NOTE — Assessment & Plan Note (Signed)
ACEi started fall 2021   Advised many of the symptoms of acei intolerance overlap with resp flares for future reference and only way to tell them apart is trial of arb         Each maintenance medication was reviewed in detail including emphasizing most importantly the difference between maintenance and prns and under what circumstances the prns are to be triggered using an action plan format where appropriate.  Total time for H and P, chart review, counseling, reviewing   and generating customized AVS unique to this office visit / same day charting = 25 mn

## 2021-03-04 DIAGNOSIS — C109 Malignant neoplasm of oropharynx, unspecified: Secondary | ICD-10-CM | POA: Insufficient documentation

## 2021-03-07 DIAGNOSIS — C779 Secondary and unspecified malignant neoplasm of lymph node, unspecified: Secondary | ICD-10-CM | POA: Insufficient documentation

## 2021-03-07 DIAGNOSIS — R590 Localized enlarged lymph nodes: Secondary | ICD-10-CM | POA: Insufficient documentation

## 2021-03-16 DIAGNOSIS — C099 Malignant neoplasm of tonsil, unspecified: Secondary | ICD-10-CM | POA: Insufficient documentation

## 2021-06-09 DIAGNOSIS — D649 Anemia, unspecified: Secondary | ICD-10-CM | POA: Insufficient documentation

## 2021-10-10 ENCOUNTER — Encounter: Payer: Self-pay | Admitting: Internal Medicine

## 2021-10-10 ENCOUNTER — Ambulatory Visit (INDEPENDENT_AMBULATORY_CARE_PROVIDER_SITE_OTHER): Payer: 59 | Admitting: Internal Medicine

## 2021-10-10 DIAGNOSIS — J479 Bronchiectasis, uncomplicated: Secondary | ICD-10-CM | POA: Diagnosis not present

## 2021-10-10 MED ORDER — PREDNISONE 10 MG PO TABS: ORAL_TABLET | ORAL | 0 refills | Status: DC

## 2021-10-10 MED ORDER — BUDESONIDE-FORMOTEROL FUMARATE 80-4.5 MCG/ACT IN AERO: INHALATION_SPRAY | RESPIRATORY_TRACT | 12 refills | Status: DC

## 2021-10-10 NOTE — Progress Notes (Signed)
Subjective:    Patient ID: Tom Richardson, male    DOB: [birth year]      MRN: [id number]     Brief patient profile:  66 yowm MM/never smoker multiple pna as child by HS was fine and then in his 66's head > bad chest cold frequently requiring ov's but many times resolving p several weeks then pna  08/2014 LLL but did not fully recover with lingering sensation of chest tightness then sick again June 2017 abruptly ill with cough green mucus > ER Eval > levaquin x 14 days back to baseline and referred to pulmonary clinic 05/02/2016 by Dr   [name] dx as bronchiectasis by HRCT 04/14/16    History of Present Illness  11/12/2015 1st [facility] office visit/ Lurlene Ronda   Chief Complaint  Patient presents with   Pulmonary Consult    Referred by [name], PA. Pt states he has had PNA multiple times since age 66, last time dxed on 10/27/15.  He has occ trouble swallowing.    last episode of pna 4/06/10/15 was post basal segment on L and no f/u cxr's prior to acutely worse again in mid June 2017 with fever and green sputum resolved p levaquin but CT chest 10/27/15 still not resolved, assoc with dysphagia and chronic nasal congestion as well rec Please remember to go to the lab and x-ray department downstairs for your tests - we will call you with the results when they are available. schedule Dg Esophagram and sinus CT> neg  GERD diet  If cough with green sputum recurs > repeat the levaquin course you have on hand Please schedule a follow up office visit in 6 weeks, call sooner if needed > did not return      07/19/2020  f/u ov/Tom Richardson re: bronchiectasis s airflow obst on prn levaquin 750 x 5 d On lisinopril  Chief Complaint  Patient presents with   Follow-up    No issues  Dyspnea:  Not limited by breathing from desired activities  Walks dog, some hills Cough: none  Sleeping:better on cpap (old equipment)  SABA use: none 02: none  Covid status:  vax x 3  Rec Consider d/c acei   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 He has completed round of levaquin.   Dyspnea:  no longer walking dog  Cough: mostly clear p 5 day for flare onset Oct 02 2021 / better p depomedrol / also on zyrtec for watery nasal d/c not helping  Sleeping: no longer on cpap  SABA use: none  02: none  Covid status:    x 3    No obvious day to day or daytime variability or assoc excess/ purulent sputum or mucus plugs or hemoptysis or cp or chest tightness, subjective wheeze or overt sinus or hb symptoms.   Sleeping ok despite above  without nocturnal  or early am exacerbation  of respiratory  c/o's or need for noct saba. Also denies any obvious fluctuation of symptoms with weather or environmental changes or other aggravating or alleviating factors except as outlined above   No unusual exposure hx or h/o childhood pna/ asthma or knowledge of premature birth.  Current Allergies, Complete Past Medical History,  Past Surgical History, Family History, and Social History were reviewed in [employer] record.  ROS  The following are not active complaints unless bolded Hoarseness, sore throat, dysphagia, dental problems, itching, sneezing,  nasal congestion or discharge of excess mucus/watery  or purulent secretions, ear ache,   fever, chills, sweats, unintended wt loss or wt gain, classically pleuritic or exertional cp,  orthopnea pnd or arm/hand swelling  or leg swelling, presyncope, palpitations, abdominal pain, anorexia, nausea, vomiting, diarrhea  or change in bowel habits or change in bladder habits, change in stools or change in urine, dysuria, hematuria,  rash, arthralgias, visual complaints, headache, numbness, weakness or ataxia or problems with walking or coordination,  change in  mood or  memory.        Current Meds  Medication Sig   Guaifenesin (MUCINEX MAXIMUM STRENGTH) 1200 MG TB12 Take 1 tablet by mouth in the morning and at bedtime.                       Objective:   Physical Exam  10/10/2021          168  07/19/2020       189 05/30/2019        187  07/31/2016        186  05/02/2016      196   04/04/2016     192   11/12/15 195 lb (88.451 kg)  11/02/15 196 lb (88.905 kg)  10/27/15 190 lb (86.183 kg)      Vital signs reviewed  10/10/2021  - Note at rest 02 sats  97% on RA   General appearance:    hoarse amb wm with prominent  pseudowheeze    HEENT : Oropharynx  clear  Nasal turbintes mild edema    NECK :  without  appent JVD/ palpable Nodes/TM    LUNGS: no acc muscle use,  Nl contour chest which is clear to A and P bilaterally without cough on insp or exp maneuvers   CV:  RRR  no s3 or murmur or increase in P2, and no edema   ABD:  soft and nontender with nl inspiratory excursion in the supine position. No bruits or organomegaly appreciated   MS:  Nl gait/ ext warm without deformities Or obvious joint restrictions  calf tenderness, cyanosis or clubbing     SKIN: warm and dry without lesions    NEURO:  alert, approp, nl sensorium with  no motor or cerebellar deficits apparent.      I personally reviewed images and agree with radiology impression as follows:  CXR:   pa and lat  10/05/21  No active cardiopulmonary disease.   Assessment & Plan:

## 2021-10-10 NOTE — Patient Instructions (Addendum)
Symbicort 80 Take 2 puffs first thing in am and then another 2 puffs about 12 hours later.    Prednisone 10 mg take  4 each am x 2 days,   2 each am x 2 days,  1 each am x 2 days and stop   Try prilosec otc '20mg'$   Take 30-60 min before first meal of the day and Pepcid ac (famotidine) 20 mg one @  bedtime until cough is completely gone for at least a week without the need for cough suppression  GERD (REFLUX)  is an extremely common cause of respiratory symptoms just like yours , many times with no obvious heartburn at all.    It can be treated with medication, but also with lifestyle changes including elevation of the head of your bed (ideally with 6-8inch blocks under the headboard of your bed),  Smoking cessation, avoidance of late meals, excessive alcohol, and avoid fatty foods, chocolate, peppermint, colas, red wine, and acidic juices such as orange juice.  NO MINT OR MENTHOL PRODUCTS SO NO COUGH DROPS  USE SUGARLESS CANDY INSTEAD (Jolley ranchers or Stover's or Life Savers) or even ice chips will also do - the key is to swallow to prevent all throat clearing. NO OIL BASED VITAMINS - use powdered substitutes.  Avoid fish oil when coughing.   Rattling cough > mucinex dm 1200 mg every 12 hours or Delsym for dry cough  (not both)   Please schedule a follow up office visit in 6 weeks, call sooner if needed - bring inhalers with you

## 2021-10-10 NOTE — Assessment & Plan Note (Signed)
Pneumonia as child  See CXR 08/08/2015  See CT chest  10/27/15  Sinus CT 12/01/2015 > 1. Mucosal thickening in the frontal sinus, some ethmoid air cells, and the maxillary sinuses. No air-fluid level. 2. Minimally prominent nasal turbinates and slight nasal septal deviation to the left. UGI 12/01/2015 > Normal barium esophagram.  Immunoglobulins  11/12/2015 > nl / IgE 8 neg RAST - HRCT 04/14/16 1. Very mild diffuse cylindrical bronchiectasis. This is associated with some very mild peribronchovascular predominant ground-glass attenuation which likely reflects underlying inflammation s PF  - alpha one AT screen 05/02/2016   MM nl levels - 05/02/2016  rec prn  levaquin 750 mg 5 days   - PFT's  07/31/2016 wnl     Mild flare with purulent secretions cleared on levaquin but only transiently better p Depomedrol injection ? Developing AB vs this is all Upper airway cough syndrome (previously labeled PNDS),  is so named because it's frequently impossible to sort out how much is  CR/sinusitis with freq throat clearing (which can be related to primary GERD)   vs  causing  secondary (" extra esophageal")  GERD from wide swings in gastric pressure that occur with throat clearing, often  promoting self use of mint and menthol lozenges that reduce the lower esophageal sphincter tone and exacerbate the problem further in a cyclical fashion.   These are the same pts (now being labeled as having "irritable larynx syndrome" by some cough centers) who not infrequently have a history of having failed to tolerate ace inhibitors,  dry powder inhalers or biphosphonates or report having atypical/extraesophageal reflux symptoms that don't respond to standard doses of PPI  and are easily confused as having aecopd or asthma flares by even experienced allergists/ pulmonologists (myself included).   rec Try prednisone x 6 days only  Max rx for gerd with otcs for now  Try on low dose symbicort 2bid (high doses risk aggravating UACS    - The proper method of use, as well as anticipated side effects, of a metered-dose inhaler were discussed and demonstrated to the patient using teach back method.   >>> f/u 6 weeks with all meds in hand using a trust but verify approach to confirm accurate Medication  Reconciliation The principal here is that until we are certain that the  patients are doing what we've asked, it makes no sense to ask them to do more.          Each maintenance medication was reviewed in detail including emphasizing most importantly the difference between maintenance and prns and under what circumstances the prns are to be triggered using an action plan format where appropriate.  Total time for H and P, chart review, counseling, reviewing hfa  device(s) and generating customized AVS unique to this acute  office visit / same day charting > 30 min for refractory resp symptoms of ? Etiology

## 2021-10-11 ENCOUNTER — Other Ambulatory Visit (HOSPITAL_COMMUNITY): Payer: Self-pay

## 2021-11-28 ENCOUNTER — Other Ambulatory Visit: Payer: Self-pay | Admitting: Internal Medicine

## 2021-11-28 ENCOUNTER — Ambulatory Visit (INDEPENDENT_AMBULATORY_CARE_PROVIDER_SITE_OTHER): Payer: 59 | Admitting: Internal Medicine

## 2021-11-28 ENCOUNTER — Encounter: Payer: Self-pay | Admitting: Internal Medicine

## 2021-11-28 DIAGNOSIS — A6920 Lyme disease, unspecified: Secondary | ICD-10-CM | POA: Diagnosis not present

## 2021-11-28 DIAGNOSIS — J479 Bronchiectasis, uncomplicated: Secondary | ICD-10-CM

## 2021-11-28 MED ORDER — LEVOFLOXACIN 750 MG PO TABS: 750.0000 mg | ORAL_TABLET | Freq: Every day | ORAL | 5 refills | Status: DC

## 2021-11-28 NOTE — Assessment & Plan Note (Signed)
Requested titers due to sense of fatigue with prior successful rx per PCP  >>> check titers, defer rx to PCP    F/u here q 6 m, sooner if needed           Each maintenance medication was reviewed in detail including emphasizing most importantly the difference between maintenance and prns and under what circumstances the prns are to be triggered using an action plan format where appropriate.  Total time for H and P, chart review, counseling, reviewing hfa device(s) and generating customized AVS unique to this office visit / same day charting = 28 min

## 2021-11-28 NOTE — Progress Notes (Signed)
Subjective:    Patient ID: Tom Richardson, male    DOB: [birth year]      MRN: [id number]     Brief patient profile:  67 yowm MM/never smoker multiple pna as child by HS was fine and then in his 30's head > bad chest cold frequently requiring ov's but many times resolving p several weeks then pna  08/2014 LLL but did not fully recover with lingering sensation of chest tightness then sick again June 2017 abruptly ill with cough green mucus > ER Eval > levaquin x 14 days back to baseline and referred to pulmonary clinic 05/02/2016 by Dr   [name] dx as bronchiectasis by HRCT 04/14/16    History of Present Illness  11/12/2015 1st [facility] office visit/ Viki Carrera   Chief Complaint  Patient presents with   Pulmonary Consult    Referred by [name], PA. Pt states he has had PNA multiple times since age 79, last time dxed on 10/27/15.  He has occ trouble swallowing.    last episode of pna 4/06/10/15 was post basal segment on L and no f/u cxr's prior to acutely worse again in mid June 2017 with fever and green sputum resolved p levaquin but CT chest 10/27/15 still not resolved, assoc with dysphagia and chronic nasal congestion as well rec Please remember to go to the lab and x-ray department downstairs for your tests - we will call you with the results when they are available. schedule Dg Esophagram and sinus CT> neg  GERD diet  If cough with green sputum recurs > repeat the levaquin course you have on hand Please schedule a follow up office visit in 6 weeks, call sooner if needed > did not return      07/19/2020  f/u ov/Rome Echavarria re: bronchiectasis s airflow obst on prn levaquin 750 x 5 d On lisinopril  Chief Complaint  Patient presents with   Follow-up    No issues  Dyspnea:  Not limited by breathing from desired activities  Walks dog, some hills Cough: none  Sleeping:better on cpap (old equipment)  SABA use: none 02: none  Covid status:  vax x 3  Rec Consider d/c acei   ENT surgery  Oct 2022 / RT/ 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 He has completed round of levaquin.   Dyspnea:  no longer walking dog  Cough: mostly clear p 5 day for flare onset Oct 02 2021 / better p depomedrol / also on zyrtec for watery nasal d/c not helping  Sleeping: no longer on cpap  SABA use: none  02: none  Covid status:    x 3  Rec Symbicort 80 Take 2 puffs first thing in am and then another 2 puffs about 12 hours later.   Prednisone 10 mg take  4 each am x 2 days,   2 each am x 2 days,  1 each am x 2 days and stop  Try prilosec otc '20mg'$   Take 30-60 min before first meal of the day and Pepcid ac (famotidine) 20 mg one @  bedtime until cough is completely gone GERD diet  Rattling cough > mucinex dm 1200 mg every 12 hours or Delsym for dry cough  (not both)  Please schedule a follow up office visit in 6 weeks, call sooner if needed - bring inhalers with you   11/28/2021  f/u ov/Lyric Rossano re: bronchiectasis with nl baseline pfts   maint on symbicort 80 2bid  for AB component  Chief Complaint  Patient presents with   Follow-up    Breathing has improved some since the last visit. He is coughing less.   Dyspnea:  lots of weed eating  / walking dog again some  hills  Cough: resolved  Sleeping: flat be with 2 pillows no change baseline  SABA use: none  02: none      No obvious day to day or daytime variability or assoc excess/ purulent sputum or mucus plugs or hemoptysis or cp or chest tightness, subjective wheeze or overt sinus or hb symptoms.   Sleeping as above  without nocturnal  or early am exacerbation  of respiratory  c/o's or need for noct saba. Also denies any obvious fluctuation of symptoms with weather or environmental changes or other aggravating or alleviating factors except as outlined above    No unusual exposure hx or h/o childhood pna/ asthma or knowledge of premature birth.  Current Allergies, Complete Past Medical History, Past Surgical History, Family History, and Social History were reviewed in [employer] record.  ROS  The following are not active complaints unless bolded Hoarseness, sore throat, dysphagia, dental problems, itching, sneezing,  nasal congestion or discharge of excess mucus or purulent secretions, ear ache,   fever, chills, sweats, unintended wt loss or wt gain, classically pleuritic or exertional cp,  orthopnea pnd or arm/hand swelling  or leg swelling, presyncope, palpitations, abdominal pain, anorexia, nausea, vomiting, diarrhea  or change in bowel habits or change in bladder habits, change in stools or change in urine, dysuria, hematuria,  rash, arthralgias, visual complaints, headache, numbness, weakness or ataxia or problems with walking or coordination,  change in mood or  memory.        Current Meds  Medication Sig   budesonide-formoterol (SYMBICORT) 80-4.5 MCG/ACT inhaler Take 2 puffs first thing in am and then another 2 puffs about 12 hours later.   tamsulosin (FLOMAX) 0.4 MG CAPS capsule Take 0.4 mg by mouth daily.                                 Objective:   Physical Exam  11/28/2021        170  10/10/2021          168  07/19/2020       189 05/30/2019        187  07/31/2016        186  05/02/2016      196   04/04/2016     192   11/12/15 195 lb (88.451 kg)  11/02/15 196 lb (88.905 kg)  10/27/15 190 lb (86.183 kg)    Vital signs reviewed  11/28/2021  - Note at rest 02 sats  98% on RA   General appearance:    amb mod hoarse wm nad    HEENT : Oropharynx  clear      Nasal turbinates nl    NECK :  without  apparent JVD/ palpable Nodes/TM    LUNGS: no acc muscle use,  Nl contour chest which is clear to A and P bilaterally without cough on insp or exp maneuvers   CV:  RRR  no s3 or murmur  or increase in P2,  and no edema   ABD:  soft and nontender with nl inspiratory excursion in the supine position. No bruits or organomegaly appreciated   MS:  Nl gait/ ext warm without deformities Or obvious joint restrictions  calf tenderness, cyanosis or clubbing    SKIN: warm and dry without lesions    NEURO:  alert, approp, nl sensorium with  no motor or cerebellar deficits apparent.         Assessment & Plan:

## 2021-11-28 NOTE — Assessment & Plan Note (Addendum)
Pneumonia as child  See CXR 08/08/2015  See CT chest  10/27/15  Sinus CT 12/01/2015 > 1. Mucosal thickening in the frontal sinus, some ethmoid air cells, and the maxillary sinuses. No air-fluid level. 2. Minimally prominent nasal turbinates and slight nasal septal deviation to the left. UGI 12/01/2015 > Normal barium esophagram.  Immunoglobulins  11/12/2015 > nl / IgE 8 neg RAST - HRCT 04/14/16 1. Very mild diffuse cylindrical bronchiectasis. This is associated with some very mild peribronchovascular predominant ground-glass attenuation which likely reflects underlying inflammation s PF  - alpha one AT screen 05/02/2016   MM nl levels - 05/02/2016  rec prn  levaquin 750 mg 5 days   - PFT's  07/31/2016 wnl   - 11/28/2021  After extensive coaching inhaler device,  effectiveness =  90% to sok to use symbicort 1-2 bid Based on two studies from NEJM  378; 20 p 1865 (2018) and 380 : p2020-30 (2019) in pts with mild asthma it is reasonable to use low dose symbicort eg 80 2bid "prn" flare in this setting but I emphasized this was only shown with symbicort and takes advantage of the rapid onset of action but is not the same as "rescue therapy" but can be stopped once the acute symptoms have resolved and the need for rescue has been minimized (< 2 x weekly)

## 2021-11-28 NOTE — Patient Instructions (Addendum)
Symbicort can be 1-2 puffs  up to every 12 hours as needed for cough/ wheeze/short of breath   For nasty mucus > levaquin 750 mg one daily x  5 days refillable   Please remember to go to the lab department   for your tests - we will call you with the results when they are available.      Please schedule a follow up visit in 3 months but call sooner if needed

## 2021-12-01 LAB — LYME DISEASE, WESTERN BLOT
IgG P18 Ab.: ABSENT
IgG P23 Ab.: ABSENT
IgG P28 Ab.: ABSENT
IgG P30 Ab.: ABSENT
IgG P39 Ab.: ABSENT
IgG P41 Ab.: ABSENT
IgG P45 Ab.: ABSENT
IgG P58 Ab.: ABSENT
IgG P66 Ab.: ABSENT
IgG P93 Ab.: ABSENT
IgM P23 Ab.: ABSENT
IgM P39 Ab.: ABSENT
IgM P41 Ab.: ABSENT
Lyme IgG Wb: NEGATIVE
Lyme IgM Wb: NEGATIVE

## 2021-12-01 NOTE — Progress Notes (Signed)
Spoke with pt and notified of results per Dr. Wert. Pt verbalized understanding and denied any questions. 

## 2022-03-06 ENCOUNTER — Ambulatory Visit: Payer: 59 | Admitting: Internal Medicine

## 2022-03-21 ENCOUNTER — Ambulatory Visit: Payer: 59 | Admitting: Internal Medicine

## 2022-03-21 ENCOUNTER — Encounter: Payer: Self-pay | Admitting: Internal Medicine

## 2022-03-21 ENCOUNTER — Ambulatory Visit (INDEPENDENT_AMBULATORY_CARE_PROVIDER_SITE_OTHER): Payer: 59 | Admitting: Internal Medicine

## 2022-03-21 DIAGNOSIS — J479 Bronchiectasis, uncomplicated: Secondary | ICD-10-CM | POA: Diagnosis not present

## 2022-03-21 MED ORDER — LEVOFLOXACIN 750 MG PO TABS: 750.0000 mg | ORAL_TABLET | Freq: Every day | ORAL | 0 refills | Status: DC

## 2022-03-21 MED ORDER — LEVOFLOXACIN 750 MG PO TABS: 750.0000 mg | ORAL_TABLET | Freq: Every day | ORAL | 11 refills | Status: DC

## 2022-03-21 NOTE — Assessment & Plan Note (Signed)
Pneumonia as child  See CXR 08/08/2015  See CT chest  10/27/15  Sinus CT 12/01/2015 > 1. Mucosal thickening in the frontal sinus, some ethmoid air cells, and the maxillary sinuses. No air-fluid level. 2. Minimally prominent nasal turbinates and slight nasal septal deviation to the left. UGI 12/01/2015 > Normal barium esophagram.  Immunoglobulins  11/12/2015 > nl / IgE 8 neg RAST - HRCT 04/14/16 1. Very mild diffuse cylindrical bronchiectasis. This is associated with some very mild peribronchovascular predominant ground-glass attenuation which likely reflects underlying inflammation s PF  - alpha one AT screen 05/02/2016   MM nl levels - 05/02/2016  rec prn  levaquin 750 mg 5 days   - PFT's  07/31/2016 wnl  - 11/28/2021  After extensive coaching inhaler device,  effectiveness =  90%      Airway symptoms controlled with symbicort 80 2bid prn which takes advantage of the rapid onset component to treat flares plus provided with levquin 750 x 5 day courses for exacerbations with purulent secretions  F/u can be yearly,sooner prn         Each maintenance medication was reviewed in detail including emphasizing most importantly the difference between maintenance and prns and under what circumstances the prns are to be triggered using an action plan format where appropriate.  Total time for H and P, chart review, counseling, reviewing hfa device(s) and generating customized AVS unique to this office visit / same day charting = 24 min

## 2022-03-21 NOTE — Patient Instructions (Addendum)
Ok to use symbicort 80 up to 2 puffs every 12 hours x one week then taper off   Levaquin 750 one daily for flare, stop if joints ache   Please schedule a follow up visit in 12 months but call sooner if needed

## 2022-03-21 NOTE — Progress Notes (Signed)
Subjective:    Patient ID: Tom Richardson, male    DOB: [birth year]      MRN: [id number]     Brief patient profile:  67 yowm MM/never smoker multiple pna as child by HS was fine and then in his 30's head > bad chest cold frequently requiring ov's but many times resolving p several weeks then pna  08/2014 LLL but did not fully recover with lingering sensation of chest tightness then sick again June 2017 abruptly ill with cough green mucus > ER Eval > levaquin x 14 days back to baseline and referred to pulmonary clinic 05/02/2016 by Dr   [name] dx as bronchiectasis by HRCT 04/14/16    History of Present Illness  11/12/2015 1st [facility] office visit/ Tom Richardson   Chief Complaint  Patient presents with   Pulmonary Consult    Referred by [name], PA. Pt states he has had PNA multiple times since age 79, last time dxed on 10/27/15.  He has occ trouble swallowing.    last episode of pna 4/06/10/15 was post basal segment on L and no f/u cxr's prior to acutely worse again in mid June 2017 with fever and green sputum resolved p levaquin but CT chest 10/27/15 still not resolved, assoc with dysphagia and chronic nasal congestion as well rec Please remember to go to the lab and x-ray department downstairs for your tests - we will call you with the results when they are available. schedule Dg Esophagram and sinus CT> neg  GERD diet  If cough with green sputum recurs > repeat the levaquin course you have on hand Please schedule a follow up office visit in 6 weeks, call sooner if needed > did not return      07/19/2020  f/u ov/Tom Richardson re: bronchiectasis s airflow obst on prn levaquin 750 x 5 d On lisinopril  Chief Complaint  Patient presents with   Follow-up    No issues  Dyspnea:  Not limited by breathing from desired activities  Walks dog, some hills Cough: none  Sleeping:better on cpap (old equipment)  SABA use: none 02: none  Covid status:  vax x 3  Rec Consider d/c acei   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 He has completed round of levaquin.   Dyspnea:  no longer walking dog  Cough: mostly clear p 5 day for flare onset Oct 02 2021 / better p depomedrol / also on zyrtec for watery nasal d/c not helping  Sleeping: no longer on cpap  SABA use: none  02: none  Covid status:    x 3  Rec Symbicort 80 Take 2 puffs first thing in am and then another 2 puffs about 12 hours later.   Prednisone 10 mg take  4 each am x 2 days,   2 each am x 2 days,  1 each am x 2 days and stop  Try prilosec otc '20mg'$   Take 30-60 min before first meal of the day and Pepcid ac (famotidine) 20 mg one @  bedtime until cough is completely gone GERD diet  Rattling cough > mucinex dm 1200 mg every 12 hours or Delsym for dry cough  (not both)  Please schedule a follow up office visit in 6 weeks, call sooner if needed - bring inhalers with you   11/28/2021  f/u ov/Tom Richardson re: bronchiectasis with nl baseline pfts   maint on symbicort 80 2bid  for AB component  Chief Complaint  Patient presents with   Follow-up    Breathing has improved some since the last visit. He is coughing less.   Dyspnea:  lots of weed eating  / walking dog again some  hills  Cough: resolved  Sleeping: flat be with 2 pillows no change baseline  SABA use: none  02: none  Rec Symbicort can be 1-2 puffs  up to every 12 hours as needed for cough/ wheeze/short of breath For nasty mucus > levaquin 750 mg one daily x  5 days refillable Please remember to go to the lab department   for your tests - we will call you with the results when they are available.      03/21/2022  f/u ov/Tom Richardson re: bronchiectasis maint on prn symbicort not needing  at all x 2 m Chief Complaint  Patient presents with   Follow-up    Breathing is  about the same. He states he stopped the symbicort 2 months ago and can not tell a difference.   Dyspnea:  no change ex tol off symbicort  -  dog walking/ working on motorcycles  Cough: minimal in am mucoid  Sleeping: flat bed 2 pillows no cpap  SABA use: none  02: none      No obvious day to day or daytime variability or assoc excess/ purulent sputum or mucus plugs or hemoptysis or cp or chest tightness, subjective wheeze or overt sinus or hb symptoms.   Sleeping  without nocturnal  exacerbation  of respiratory  c/o's or need for noct saba. Also denies any obvious fluctuation of symptoms with weather or environmental changes or other aggravating or alleviating factors except as outlined above   No unusual exposure hx or h/o childhood pna/ asthma or knowledge of premature birth.  Current Allergies, Complete Past Medical History, Past Surgical History, Family History, and Social History were reviewed in [employer] record.  ROS  The following are not active complaints unless bolded Hoarseness, sore throat, dysphagia, dental problems, itching, sneezing,  nasal congestion or discharge of excess mucus or purulent secretions, ear ache,   fever, chills, sweats, unintended wt loss or wt gain, classically pleuritic or exertional cp,  orthopnea pnd or arm/hand swelling  or leg swelling, presyncope, palpitations, abdominal pain, anorexia, nausea, vomiting, diarrhea  or change in bowel habits or change in bladder habits, change in stools or change in urine, dysuria, hematuria,  rash, arthralgias, visual complaints, headache, numbness, weakness or ataxia or problems with walking or coordination,  change in mood or  memory.        Current Meds  Medication Sig   tamsulosin (FLOMAX) 0.4 MG CAPS capsule Take 0.4 mg by mouth daily.                           Objective:   Physical Exam  Wts  03/21/2022       176  11/28/2021        170  10/10/2021          168  07/19/2020        189 05/30/2019        187  07/31/2016        186  05/02/2016      196   04/04/2016     192   11/12/15 195 lb (88.451 kg)  11/02/15 196 lb (88.905 kg)  10/27/15 190 lb (86.183 kg)    Vital signs reviewed  03/21/2022  - Note at rest 02 sats  100% on RA   General appearance:    amb hoarse pleasant wm nad   HEENT : Oropharynx  clear      Nasal turbinates nl    NECK :  without  apparent JVD/ palpable Nodes/TM    LUNGS: no acc muscle use,  Nl contour chest with min insp/exp rhonchi bilaterally without cough on insp or exp maneuvers   CV:  RRR  no s3 or murmur or increase in P2, and no edema   ABD:  soft and nontender with nl inspiratory excursion in the supine position. No bruits or organomegaly appreciated   MS:  Nl gait/ ext warm without deformities Or obvious joint restrictions  calf tenderness, cyanosis or clubbing    SKIN: warm and dry without lesions    NEURO:  alert, approp, nl sensorium with  no motor or cerebellar deficits apparent.            Assessment & Plan:

## 2023-03-25 NOTE — Progress Notes (Unsigned)
Subjective:    Patient ID: Tom Richardson, male    DOB: 1955/07/03      MRN: [id number]     Brief patient profile:  67 yowm MM/never smoker multiple pna as child by HS was fine and then in his 30's head > bad chest cold frequently requiring ov's but many times resolving p several weeks then pna  08/2014 LLL but did not fully recover with lingering sensation of chest tightness then sick again June 2017 abruptly ill with cough green mucus > ER Eval > levaquin x 14 days back to baseline and referred to pulmonary clinic 05/02/2016 by Dr   Prudy Feeler dx as bronchiectasis by HRCT 04/14/16    History of Present Illness  11/12/2015 1st [facility] office visit/ Ericia Moxley   Chief Complaint  Patient presents with   Pulmonary Consult    Referred by Prudy Feeler, PA. Pt states he has had PNA multiple times since age 67, last time dxed on 10/27/15.  He has occ trouble swallowing.    last episode of pna 08/08/63/17 was post basal segment on L and no f/u cxr's prior to acutely worse again in mid June 2017 with fever and green sputum resolved p levaquin but CT chest 10/27/15 still not resolved, assoc with dysphagia and chronic nasal congestion as well rec Please remember to go to the lab and x-ray department downstairs for your tests - we will call you with the results when they are available. schedule Dg Esophagram and sinus CT> neg  GERD diet  If cough with green sputum recurs > repeat the levaquin course you have on hand Please schedule a follow up office visit in 6 weeks, call sooner if needed > did not return      07/19/2020  f/u ov/Vishal Sandlin re: bronchiectasis s airflow obst on prn levaquin 750 x 5 d On lisinopril  Chief Complaint  Patient presents with   Follow-up    No issues  Dyspnea:  Not limited by breathing from desired activities  Walks dog, some hills Cough: none  Sleeping:better on cpap (old equipment)  SABA use: none 02: none  Covid status:  vax x 3  Rec Consider d/c acei   ENT surgery  Oct 2022 / RT/ 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 He states he stopped the symbicort 2 months ago and can not tell a difference.   Dyspnea:  no change ex tol off symbicort  -  dog walking/ working on motorcycles  Cough: minimal in am mucoid  Sleeping: flat bed 2 pillows no cpap  SABA use: none  02: none Rec Ok to use symbicort 80 up to 2 puffs every 12 hours x one week then taper off  Levaquin 750 one daily for flare, stop if joints ache     03/26/2023  yearly f/u ov/[place] office/Glendy Barsanti re: bronchiectasis with nl pfts  maint on no resp rx, just levaquin 750 x 5 days prn (only used once since last ov)   Chief Complaint  Patient presents with   Shortness of Breath   Cough  Dyspnea:  Not limited by breathing from desired activities  / still walking dogs  Cough: white mucus ? Some worse  p eating but not coughing up food and no  AS dz on last CT or PET CT as of 10/23/22 or 02/06/23 respectively  Sleeping: bed is flat/ 2 pillows s resp cc  SABA use: none  02: none     No obvious day to day or daytime variability or assoc   mucus plugs or hemoptysis or cp or chest tightness, subjective wheeze or overt hb symptoms.    Also denies any obvious fluctuation of symptoms with weather or environmental changes or other aggravating or alleviating factors except as outlined above   No unusual exposure hx or h/o childhood pna/ asthma or knowledge of premature birth.  Current Allergies, Complete Past Medical History, Past Surgical History, Family History, and Social History were reviewed in [employer] record.  ROS  The following are not active complaints unless bolded Hoarseness, sore throat, dysphagia, dental problems, itching, sneezing,  nasal congestion or discharge  of excess mucus or purulent secretions, ear ache,   fever, chills, sweats, unintended wt loss or wt gain, classically pleuritic or exertional cp,  orthopnea pnd or arm/hand swelling  or leg swelling, presyncope, palpitations, abdominal pain, anorexia, nausea, vomiting, diarrhea  or change in bowel habits or change in bladder habits, change in stools or change in urine, dysuria, hematuria,  rash, arthralgias, visual complaints, headache, numbness, weakness or ataxia or problems with walking or coordination,  change in mood or  memory.        Current Meds  Medication Sig   levothyroxine (SYNTHROID) 112 MCG tablet Take 112 mcg by mouth every morning.   tamsulosin (FLOMAX) 0.4 MG CAPS capsule Take 0.4 mg by mouth daily.   zolpidem (AMBIEN) 5 MG tablet Take 5 mg by mouth at bedtime as needed.               Objective:   Physical Exam  Wts  03/26/2023       185   03/21/2022       176  11/28/2021        170  10/10/2021          168  07/19/2020       189 05/30/2019        187  07/31/2016        186  05/02/2016      196   04/04/2016     192   11/12/15 195 lb (88.451 kg)  11/02/15 196 lb (88.905 kg)  10/27/15 190 lb (86.183 kg)     Vital signs reviewed  03/26/2023  - Note at rest 02 sats  96% on RA   General appearance:    amb hoarse wm nad    HEENT : Oropharynx  clear      Nasal turbinates nl    NECK :  without  apparent JVD/ palpable Nodes/TM    LUNGS: no acc muscle use,  Nl contour chest  with a few insp squeaks bases  bilaterally without wheeze or cough on insp or exp maneuvers   CV:  RRR  no s3 or murmur or increase in P2, and no edema   ABD:  soft and nontender  MS:  Nl gait/ ext warm without deformities Or obvious joint restrictions  calf tenderness, cyanosis or clubbing    SKIN: warm and dry without lesions    NEURO:  alert, approp, nl sensorium with  no motor or cerebellar deficits apparent.     Assessment & Plan:

## 2023-03-26 ENCOUNTER — Ambulatory Visit (INDEPENDENT_AMBULATORY_CARE_PROVIDER_SITE_OTHER): Payer: Medicare Other | Admitting: Internal Medicine

## 2023-03-26 ENCOUNTER — Encounter: Payer: Self-pay | Admitting: Internal Medicine

## 2023-03-26 VITALS — BP 154/79 | HR 78 | Ht 69.0 in | Wt 185.0 lb

## 2023-03-26 DIAGNOSIS — J479 Bronchiectasis, uncomplicated: Secondary | ICD-10-CM | POA: Diagnosis not present

## 2023-03-26 MED ORDER — LEVOFLOXACIN 750 MG PO TABS: 750.0000 mg | ORAL_TABLET | Freq: Every day | ORAL | 11 refills | Status: DC

## 2023-03-26 NOTE — Patient Instructions (Signed)
For nasty mucus >  levaquin 750 mg  daily x 5 days  (refillable)   Please schedule a follow up visit in 12 months but call sooner if needed

## 2023-03-26 NOTE — Assessment & Plan Note (Signed)
Pneumonia as child/MM/ never smoker   See CXR 08/08/2015  See CT chest  10/27/15  Sinus CT 12/01/2015 > 1. Mucosal thickening in the frontal sinus, some ethmoid air cells, and the maxillary sinuses. No air-fluid level. 2. Minimally prominent nasal turbinates and slight nasal septal deviation to the left. UGI 12/01/2015 > Normal barium esophagram.  Immunoglobulins  11/12/2015 > nl / IgE 8 neg RAST - HRCT 04/14/16 1. Very mild diffuse cylindrical bronchiectasis. This is associated with some very mild peribronchovascular predominant ground-glass attenuation which likely reflects underlying inflammation s PF  - alpha one AT screen 05/02/2016   MM nl levels - 05/02/2016  rec prn  levaquin 750 mg 5 days   - PFT's  07/31/2016 wnl  - 11/28/2021  After extensive coaching inhaler device,  effectiveness =  90%      No longer needing any inhalers at all and only used one round of levaquin this year for exacerbation so no changes needed for now  I worry though about his upper airway s/p surgery / RT with sense cough is worse p eating and I note he has worked with [facility] in [place] so advised to let that office know if  coughing with meals worse or if ever coughs up food.   F/u here q 93m, sooner if needed         Each maintenance medication was reviewed in detail including emphasizing most importantly the difference between maintenance and prns and under what circumstances the prns are to be triggered using an action plan format where appropriate.  Total time for H and P, chart review, counseling, and generating customized AVS unique to this yearly office visit / same day charting = 30 min

## 2023-05-09 HISTORY — PX: THROAT SURGERY: SHX803

## 2024-04-07 ENCOUNTER — Encounter: Payer: Self-pay | Admitting: Internal Medicine

## 2024-04-07 ENCOUNTER — Ambulatory Visit: Admitting: Internal Medicine

## 2024-04-07 VITALS — BP 144/77 | HR 74 | Ht 69.0 in | Wt 198.6 lb

## 2024-04-07 DIAGNOSIS — R058 Other specified cough: Secondary | ICD-10-CM | POA: Diagnosis not present

## 2024-04-07 DIAGNOSIS — J479 Bronchiectasis, uncomplicated: Secondary | ICD-10-CM

## 2024-04-07 MED ORDER — METHYLPREDNISOLONE ACETATE 80 MG/ML IJ SUSP
120.0000 mg | Freq: Once | INTRAMUSCULAR | Status: AC
  Administered 2024-04-07: 120 mg via INTRAMUSCULAR

## 2024-04-07 NOTE — Patient Instructions (Addendum)
 For cough/ congestion >  mucinex dm  up to maximum of  1200 mg every 12 hours and use the flutter valve as much as you can    Use jollyey ranchers or life savers (not the white ones) during the day to keep from clearing your throat and have you ENT doctor look at your nose and throat wheezing at your follow up visit later this month.   If not better, call for symbicort   80 prescription.   Depomedrol 120 mg IM    Please schedule a follow up visit in 3 months but call sooner if needed

## 2024-04-07 NOTE — Assessment & Plan Note (Addendum)
 Recent flare s/p rx for metastatic thyroid ca with ongoing upper airway symptoms   Upper airway cough syndrome (previously labeled PNDS),  is so named because it's frequently impossible to sort out how much is  CR/sinusitis with freq throat clearing (which can be related to primary GERD)   vs  causing  secondary ( extra esophageal)  GERD from wide swings in gastric pressure that occur with throat clearing, often  promoting self use of mint and menthol lozenges that reduce the lower esophageal sphincter tone and exacerbate the problem further in a cyclical fashion.   These are the same pts (now being labeled as having irritable larynx syndrome by some cough centers) who not infrequently have a history of having failed to tolerate ace inhibitors,  dry powder inhalers or biphosphonates or report having atypical/extraesophageal reflux symptoms from LPR (globus, throat clearing)  that don't respond to standard doses of PPI  and are easily confused as having aecopd or asthma flares by even experienced allergists/ pulmonologists (myself included).   Rec >>> Depomedrol 120 mg I   >>> Non mint/ menthol lozenges to prevent throat clearing  >>> Avoid high doses of OCS or DPI inhalers in this setting and f/u ENT as planned          Each maintenance medication was reviewed in detail including emphasizing most importantly the difference between maintenance and prns and under what circumstances the prns are to be triggered using an action plan format where appropriate.  Total time for H and P, chart review, counseling, reviewing flutter valve  device(s) and generating customized AVS unique to this office visit / same day charting = 32 min

## 2024-04-07 NOTE — Progress Notes (Signed)
 Subjective:    Patient ID: Tom Richardson, male    DOB: [birth year]      MRN: [id number]     Brief patient profile:  68 yowm MM/never smoker multiple pna as child by HS was fine and then in his 30's head > bad chest cold frequently requiring ov's but many times resolving p several weeks then pna  08/2014 LLL but did not fully recover with lingering sensation of chest tightness then sick again June 2017 abruptly ill with cough green mucus > ER Eval > levaquin  x 14 days back to baseline and referred to pulmonary clinic 05/02/2016 by Dr   Clayborne Molt dx as bronchiectasis by HRCT 04/14/16    History of Present Illness  66/11/2015 1st [facility] office visit/ [name]   Chief Complaint  Patient presents with   Pulmonary Consult    Referred by Clayborne Molt, PA. Pt states he has had PNA multiple times since age 43, last time dxed on 10/27/15.  He has occ trouble swallowing.    last episode of pna 4/06/10/15 was post basal segment on L and no f/u cxr's prior to acutely worse again in mid June 2017 with fever and green sputum resolved p levaquin  but CT chest 10/27/15 still not resolved, assoc with dysphagia and chronic nasal congestion as well rec Please remember to go to the lab and x-ray department downstairs for your tests - we will call you with the results when they are available. schedule Dg Esophagram and sinus CT> neg  GERD diet  If cough with green sputum recurs > repeat the levaquin  course you have on hand Please schedule a follow up office visit in 6 weeks, call sooner if needed > did not return      66/14/2022  f/u ov/[name] re: bronchiectasis s airflow obst on prn levaquin  750 x 5 d On lisinopril  Chief Complaint  Patient presents with   Follow-up    No issues  Dyspnea:  Not limited by breathing from desired activities  Walks dog, some hills Cough: none  Sleeping:better on cpap (old equipment)  SABA use: none 02: none  Covid status:  vax x 3  Rec Consider d/c acei   ENT surgery  Oct 2022 / 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 Some worse  p eating but not coughing up food and no AS dz on last CT or PET CT as of 10/23/22 or 02/06/23 respectively  Sleeping: bed is flat/ 2 pillows s resp cc  SABA use: none  02: none Rec For nasty mucus >  levaquin  750 mg  daily x 5 days  (refillable)   10/25/23: Left thyroidectomy and right supraclavicular lymph node excision by Dr. 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    Also denies any obvious fluctuation of symptoms with weather or environmental changes or other aggravating or alleviating factors except as outlined above   No  unusual exposure hx or h/o childhood pna/ asthma or knowledge of premature birth.  Current Allergies, Complete Past Medical History, Past Surgical History, Family History, and Social History were reviewed in [employer] record.  ROS  The following are not active complaints unless bolded Hoarseness, sore throat/globus, dysphagia, dental problems, itching, sneezing,  nasal congestion or discharge of excess mucus or purulent secretions, ear ache,   fever, chills, sweats, unintended wt loss or wt gain, classically pleuritic or exertional cp,  orthopnea pnd or arm/hand swelling  or leg swelling, presyncope, palpitations, abdominal pain, anorexia, nausea, vomiting, diarrhea  or change in bowel habits or change in bladder habits, change in stools or change in urine, dysuria, hematuria,  rash, arthralgias, visual complaints, headache, numbness, weakness or ataxia or problems with walking or coordination,  change in mood or  memory.        Current Meds  Medication Sig   levothyroxine (SYNTHROID) 112 MCG tablet Take 112 mcg by mouth every morning.   tamsulosin (FLOMAX) 0.4 MG CAPS capsule Take 0.4 mg by mouth daily.           Past Medical History:  Diagnosis Date   Adenomatous colon polyp 06-27-2010   Chronic kidney disease    Kidney stones    Pneumonia    2 x in the last year    Sleep apnea    wears cpap           Objective:   Physical Exam  Wts  .66/05/2023        198   66/18/2024       185   03/21/2022       176  11/28/2021        170  10/10/2021          168  66/14/2022       189 05/30/2019        187  07/31/2016        186  05/02/2016      196   04/04/2016     192   11/12/15 195 lb (88.451 kg)  11/02/15 196 lb (88.905 kg)  10/27/15 190 lb (86.183 kg)    Vital signs reviewed  66/05/2023  - Note at rest 02 sats  97% on RA   General appearance:    hoarse amb wm / minimal rattling with persistent upper airway wheezes louder on inspiration     HEENT :  Oropharynx  clear      Nasal turbinates mod edema no polyps or purulence    NECK :  without  apparent JVD/ palpable Nodes/TM    LUNGS: no acc muscle use,  Nl contour chest which is clear to A and P bilaterally without cough on insp or exp maneuvers   CV:  RRR  no s3 or murmur or increase in P2, and no edema   ABD:  soft and nontender   MS:  Gait nl  ext warm without deformities Or obvious joint restrictions  calf tenderness, cyanosis or clubbing    SKIN: warm and dry without lesions    NEURO:  alert, approp, nl sensorium with  no motor or cerebellar deficits apparent.    Assessment & Plan:   Assessment & Plan Bronchiectasis without acute exacerbation (HCC) Pneumonia as child/MM/ never smoker   See CXR 08/08/2015  See CT chest  10/27/15  Sinus CT 12/01/2015 > 1. Mucosal thickening in the frontal sinus, some ethmoid air cells, and the maxillary sinuses. No air-fluid level. 2. Minimally prominent nasal turbinates and slight nasal septal deviation to the left. UGI 12/01/2015 > Normal barium esophagram.  Immunoglobulins  11/12/2015 > nl / IgE 8 neg RAST - HRCT 04/14/16 1. Very mild diffuse cylindrical bronchiectasis. This is associated with some very mild peribronchovascular predominant ground-glass attenuation which likely reflects underlying inflammation s PF  - alpha one AT screen 05/02/2016   MM nl levels - 05/02/2016  rec prn  levaquin  750 mg 5 days   - PFT's  07/31/2016 wnl  - 11/28/2021  After extensive coaching inhaler device,  effectiveness =  90%    - Flutter valve training 66/05/2023 >>>     If not effective add back low dose symbicort  = 80  2bid  Upper airway cough syndrome Recent flare s/p rx for metastatic thyroid ca with ongoing upper airway symptoms   Upper airway cough syndrome (previously labeled PNDS),  is so named because it's frequently impossible to sort out how much is  CR/sinusitis with freq throat clearing (which can be related to primary GERD)   vs  causing   secondary ( extra esophageal)  GERD from wide swings in gastric pressure that occur with throat clearing, often  promoting self use of mint and menthol lozenges that reduce the lower esophageal sphincter tone and exacerbate the problem further in a cyclical fashion.   These are the same pts (now being labeled as having irritable larynx syndrome by some cough centers) who not infrequently have a history of having failed to tolerate ace inhibitors,  dry powder inhalers or biphosphonates or report having atypical/extraesophageal reflux symptoms from LPR (globus, throat clearing)  that don't respond to standard doses of PPI  and are easily confused as having aecopd or asthma flares by even experienced allergists/ pulmonologists (myself included).   Rec >>> Depomedrol 120 mg I   >>> Non mint/ menthol lozenges to prevent throat clearing  >>> Avoid high doses of OCS or DPI inhalers in this setting and f/u ENT as planned          Each maintenance medication was reviewed in detail including emphasizing most importantly the difference between maintenance and prns and under what circumstances the prns are to be triggered using an action plan format where appropriate.  Total time for H and P, chart review, counseling, reviewing flutter valve  device(s) and generating customized AVS unique to this office visit / same day charting = 32 min          AVS  Patient Instructions  For cough/ congestion >  mucinex dm  up to  maximum of  1200 mg every 12 hours and use the flutter valve as much as you can    Use jollyey ranchers or life savers (not the white ones) during the day to keep from clearing your throat and have you ENT doctor look at your nose and throat wheezing at your follow up visit later this month.   If not better, call for symbicort   80 prescription.   Depomedrol 120 mg IM    Please schedule a follow up visit in 3 months but call sooner if needed       Ozell America, MD 66/05/2023

## 2024-04-07 NOTE — Assessment & Plan Note (Addendum)
 Pneumonia as child/MM/ never smoker   See CXR 08/08/2015  See CT chest  10/27/15  Sinus CT 12/01/2015 > 1. Mucosal thickening in the frontal sinus, some ethmoid air cells, and the maxillary sinuses. No air-fluid level. 2. Minimally prominent nasal turbinates and slight nasal septal deviation to the left. UGI 12/01/2015 > Normal barium esophagram.  Immunoglobulins  11/12/2015 > nl / IgE 8 neg RAST - HRCT 04/14/16 1. Very mild diffuse cylindrical bronchiectasis. This is associated with some very mild peribronchovascular predominant ground-glass attenuation which likely reflects underlying inflammation s PF  - alpha one AT screen 05/02/2016   MM nl levels - 05/02/2016  rec prn  levaquin  750 mg 5 days   - PFT's  07/31/2016 wnl  - 11/28/2021  After extensive coaching inhaler device,  effectiveness =  90%    - Flutter valve training 04/07/2024 >>>     If not effective add back low dose symbicort  = 80  2bid

## 2024-04-28 ENCOUNTER — Other Ambulatory Visit: Payer: Self-pay | Admitting: Internal Medicine

## 2024-04-28 DIAGNOSIS — J479 Bronchiectasis, uncomplicated: Secondary | ICD-10-CM

## 2024-04-30 NOTE — Telephone Encounter (Signed)
 Please advise pt is requesting refill for Levaquin , med is not mentioned in lov note.

## 2024-07-07 ENCOUNTER — Ambulatory Visit: Admitting: Internal Medicine
